# Patient Record
Sex: Female | Born: 1970 | Hispanic: No | Marital: Married | Smoking: Never smoker
Health system: Southern US, Community
[De-identification: ages and names within clinical notes are randomized; demographics above are authoritative.]

## PROBLEM LIST (undated history)

## (undated) DIAGNOSIS — I10 Essential (primary) hypertension: Secondary | ICD-10-CM

## (undated) HISTORY — PX: MULTIPLE TOOTH EXTRACTIONS: SHX2053

## (undated) HISTORY — PX: TUBAL LIGATION: SHX77

---

## 2001-08-27 ENCOUNTER — Other Ambulatory Visit: Admission: RE | Admit: 2001-08-27 | Discharge: 2001-08-27 | Payer: Self-pay | Admitting: Gynecology

## 2003-05-01 ENCOUNTER — Emergency Department (HOSPITAL_COMMUNITY): Admission: EM | Admit: 2003-05-01 | Discharge: 2003-05-01 | Payer: Self-pay | Admitting: *Deleted

## 2004-07-12 ENCOUNTER — Ambulatory Visit (HOSPITAL_COMMUNITY): Admission: RE | Admit: 2004-07-12 | Discharge: 2004-07-12 | Payer: Self-pay | Admitting: Family Medicine

## 2006-04-21 ENCOUNTER — Emergency Department (HOSPITAL_COMMUNITY): Admission: EM | Admit: 2006-04-21 | Discharge: 2006-04-21 | Payer: Self-pay | Admitting: Emergency Medicine

## 2006-05-28 ENCOUNTER — Ambulatory Visit (HOSPITAL_COMMUNITY): Admission: RE | Admit: 2006-05-28 | Discharge: 2006-05-28 | Payer: Self-pay | Admitting: Family Medicine

## 2008-02-18 ENCOUNTER — Encounter: Admission: RE | Admit: 2008-02-18 | Discharge: 2008-02-18 | Payer: Self-pay | Admitting: Family Medicine

## 2008-02-19 ENCOUNTER — Encounter (HOSPITAL_COMMUNITY): Admission: RE | Admit: 2008-02-19 | Discharge: 2008-03-20 | Payer: Self-pay | Admitting: Family Medicine

## 2009-01-13 ENCOUNTER — Other Ambulatory Visit: Admission: RE | Admit: 2009-01-13 | Discharge: 2009-01-13 | Payer: Self-pay | Admitting: Family Medicine

## 2010-12-10 ENCOUNTER — Encounter: Payer: Self-pay | Admitting: Family Medicine

## 2010-12-19 ENCOUNTER — Encounter
Admission: RE | Admit: 2010-12-19 | Discharge: 2010-12-19 | Payer: Self-pay | Source: Home / Self Care | Attending: Family Medicine | Admitting: Family Medicine

## 2012-01-31 ENCOUNTER — Other Ambulatory Visit: Payer: Self-pay | Admitting: Family Medicine

## 2012-01-31 DIAGNOSIS — Z1231 Encounter for screening mammogram for malignant neoplasm of breast: Secondary | ICD-10-CM

## 2012-03-04 ENCOUNTER — Ambulatory Visit: Payer: Self-pay

## 2012-03-12 ENCOUNTER — Ambulatory Visit
Admission: RE | Admit: 2012-03-12 | Discharge: 2012-03-12 | Disposition: A | Discharge status: Home / Self Care | Payer: BC Managed Care – PPO | Source: Ambulatory Visit | Attending: Family Medicine | Admitting: Family Medicine

## 2012-03-12 DIAGNOSIS — Z1231 Encounter for screening mammogram for malignant neoplasm of breast: Secondary | ICD-10-CM

## 2013-02-17 ENCOUNTER — Other Ambulatory Visit: Payer: Self-pay | Admitting: Family Medicine

## 2013-02-17 DIAGNOSIS — Z1231 Encounter for screening mammogram for malignant neoplasm of breast: Secondary | ICD-10-CM

## 2013-03-30 ENCOUNTER — Ambulatory Visit: Payer: BC Managed Care – PPO

## 2016-03-06 DIAGNOSIS — E559 Vitamin D deficiency, unspecified: Secondary | ICD-10-CM | POA: Diagnosis not present

## 2016-06-14 DIAGNOSIS — R195 Other fecal abnormalities: Secondary | ICD-10-CM | POA: Diagnosis not present

## 2016-06-25 DIAGNOSIS — N390 Urinary tract infection, site not specified: Secondary | ICD-10-CM | POA: Diagnosis not present

## 2016-07-24 DIAGNOSIS — N926 Irregular menstruation, unspecified: Secondary | ICD-10-CM | POA: Diagnosis not present

## 2016-08-10 DIAGNOSIS — N926 Irregular menstruation, unspecified: Secondary | ICD-10-CM | POA: Diagnosis not present

## 2016-09-05 ENCOUNTER — Other Ambulatory Visit: Payer: Self-pay | Admitting: Obstetrics and Gynecology

## 2016-09-20 ENCOUNTER — Ambulatory Visit (HOSPITAL_COMMUNITY): Admit: 2016-09-20 | Payer: Self-pay | Admitting: Obstetrics and Gynecology

## 2016-09-20 ENCOUNTER — Encounter (HOSPITAL_COMMUNITY): Payer: Self-pay

## 2016-09-20 SURGERY — DILATATION & CURETTAGE/HYSTEROSCOPY WITH MYOSURE
Anesthesia: Choice

## 2016-10-16 DIAGNOSIS — Z803 Family history of malignant neoplasm of breast: Secondary | ICD-10-CM | POA: Diagnosis not present

## 2016-10-16 DIAGNOSIS — Z1231 Encounter for screening mammogram for malignant neoplasm of breast: Secondary | ICD-10-CM | POA: Diagnosis not present

## 2017-08-30 DIAGNOSIS — Z23 Encounter for immunization: Secondary | ICD-10-CM | POA: Diagnosis not present

## 2017-10-17 DIAGNOSIS — Z1231 Encounter for screening mammogram for malignant neoplasm of breast: Secondary | ICD-10-CM | POA: Diagnosis not present

## 2017-10-21 DIAGNOSIS — Z6823 Body mass index (BMI) 23.0-23.9, adult: Secondary | ICD-10-CM | POA: Diagnosis not present

## 2017-10-21 DIAGNOSIS — Z01419 Encounter for gynecological examination (general) (routine) without abnormal findings: Secondary | ICD-10-CM | POA: Diagnosis not present

## 2017-10-21 DIAGNOSIS — Z1151 Encounter for screening for human papillomavirus (HPV): Secondary | ICD-10-CM | POA: Diagnosis not present

## 2017-11-01 DIAGNOSIS — Z13220 Encounter for screening for lipoid disorders: Secondary | ICD-10-CM | POA: Diagnosis not present

## 2017-11-01 DIAGNOSIS — Z0000 Encounter for general adult medical examination without abnormal findings: Secondary | ICD-10-CM | POA: Diagnosis not present

## 2017-11-01 DIAGNOSIS — Z1329 Encounter for screening for other suspected endocrine disorder: Secondary | ICD-10-CM | POA: Diagnosis not present

## 2017-11-01 DIAGNOSIS — Z131 Encounter for screening for diabetes mellitus: Secondary | ICD-10-CM | POA: Diagnosis not present

## 2018-04-04 DIAGNOSIS — H10413 Chronic giant papillary conjunctivitis, bilateral: Secondary | ICD-10-CM | POA: Diagnosis not present

## 2018-08-21 DIAGNOSIS — Z23 Encounter for immunization: Secondary | ICD-10-CM | POA: Diagnosis not present

## 2018-10-27 DIAGNOSIS — Z803 Family history of malignant neoplasm of breast: Secondary | ICD-10-CM | POA: Diagnosis not present

## 2018-10-27 DIAGNOSIS — Z1231 Encounter for screening mammogram for malignant neoplasm of breast: Secondary | ICD-10-CM | POA: Diagnosis not present

## 2018-10-30 DIAGNOSIS — M545 Low back pain: Secondary | ICD-10-CM | POA: Diagnosis not present

## 2019-04-23 DIAGNOSIS — U071 COVID-19: Secondary | ICD-10-CM | POA: Diagnosis not present

## 2019-06-23 DIAGNOSIS — H5213 Myopia, bilateral: Secondary | ICD-10-CM | POA: Diagnosis not present

## 2019-08-20 DIAGNOSIS — Z23 Encounter for immunization: Secondary | ICD-10-CM | POA: Diagnosis not present

## 2019-08-24 DIAGNOSIS — N390 Urinary tract infection, site not specified: Secondary | ICD-10-CM | POA: Diagnosis not present

## 2019-10-17 DIAGNOSIS — U071 COVID-19: Secondary | ICD-10-CM | POA: Diagnosis not present

## 2019-11-04 DIAGNOSIS — Z6822 Body mass index (BMI) 22.0-22.9, adult: Secondary | ICD-10-CM | POA: Diagnosis not present

## 2019-11-04 DIAGNOSIS — Z1151 Encounter for screening for human papillomavirus (HPV): Secondary | ICD-10-CM | POA: Diagnosis not present

## 2019-11-04 DIAGNOSIS — Z01419 Encounter for gynecological examination (general) (routine) without abnormal findings: Secondary | ICD-10-CM | POA: Diagnosis not present

## 2019-11-04 DIAGNOSIS — Z124 Encounter for screening for malignant neoplasm of cervix: Secondary | ICD-10-CM | POA: Diagnosis not present

## 2019-11-04 DIAGNOSIS — E559 Vitamin D deficiency, unspecified: Secondary | ICD-10-CM | POA: Diagnosis not present

## 2019-11-17 DIAGNOSIS — Z1211 Encounter for screening for malignant neoplasm of colon: Secondary | ICD-10-CM | POA: Diagnosis not present

## 2019-11-24 DIAGNOSIS — Z20828 Contact with and (suspected) exposure to other viral communicable diseases: Secondary | ICD-10-CM | POA: Diagnosis not present

## 2020-04-09 DIAGNOSIS — J029 Acute pharyngitis, unspecified: Secondary | ICD-10-CM | POA: Diagnosis not present

## 2020-08-20 DIAGNOSIS — Z23 Encounter for immunization: Secondary | ICD-10-CM | POA: Diagnosis not present

## 2020-08-28 DIAGNOSIS — S92002A Unspecified fracture of left calcaneus, initial encounter for closed fracture: Secondary | ICD-10-CM | POA: Diagnosis not present

## 2020-08-31 DIAGNOSIS — S92002A Unspecified fracture of left calcaneus, initial encounter for closed fracture: Secondary | ICD-10-CM | POA: Diagnosis not present

## 2020-09-01 ENCOUNTER — Other Ambulatory Visit: Payer: Self-pay | Admitting: Orthopaedic Surgery

## 2020-09-01 ENCOUNTER — Ambulatory Visit
Admission: RE | Admit: 2020-09-01 | Discharge: 2020-09-01 | Disposition: A | Discharge status: Home / Self Care | Payer: BC Managed Care – PPO | Source: Ambulatory Visit | Attending: Orthopaedic Surgery | Admitting: Orthopaedic Surgery

## 2020-09-01 DIAGNOSIS — S92002A Unspecified fracture of left calcaneus, initial encounter for closed fracture: Secondary | ICD-10-CM | POA: Diagnosis not present

## 2020-09-01 DIAGNOSIS — M79672 Pain in left foot: Secondary | ICD-10-CM

## 2020-09-01 DIAGNOSIS — R600 Localized edema: Secondary | ICD-10-CM | POA: Diagnosis not present

## 2020-09-01 DIAGNOSIS — S92212A Displaced fracture of cuboid bone of left foot, initial encounter for closed fracture: Secondary | ICD-10-CM | POA: Diagnosis not present

## 2020-09-05 ENCOUNTER — Other Ambulatory Visit: Payer: Self-pay | Admitting: Orthopaedic Surgery

## 2020-09-06 ENCOUNTER — Encounter (HOSPITAL_COMMUNITY): Payer: Self-pay | Admitting: Orthopaedic Surgery

## 2020-09-06 ENCOUNTER — Other Ambulatory Visit: Payer: Self-pay

## 2020-09-06 ENCOUNTER — Other Ambulatory Visit (HOSPITAL_COMMUNITY)
Admission: RE | Admit: 2020-09-06 | Discharge: 2020-09-06 | Disposition: A | Discharge status: Home / Self Care | Payer: BC Managed Care – PPO | Source: Ambulatory Visit | Attending: Orthopaedic Surgery | Admitting: Orthopaedic Surgery

## 2020-09-06 DIAGNOSIS — Z20822 Contact with and (suspected) exposure to covid-19: Secondary | ICD-10-CM | POA: Diagnosis not present

## 2020-09-06 DIAGNOSIS — Z01812 Encounter for preprocedural laboratory examination: Secondary | ICD-10-CM | POA: Insufficient documentation

## 2020-09-06 LAB — SARS CORONAVIRUS 2 (TAT 6-24 HRS): SARS Coronavirus 2: NEGATIVE

## 2020-09-06 NOTE — Progress Notes (Signed)
Patient was given pre-op instructions over the phone. The opportunity was given for the patient to ask questions. No further questions asked. Patient verbalized understanding of instructions given.   7 days prior to surgery STOP taking any Aspirin (unless otherwise instructed by your surgeon), Aleve, Naproxen, Ibuprofen, Motrin, Advil, Goody's, BC's, all herbal medications, fish oil, and all vitamins.      PCP - [facility] - denies  Chest x-ray - denies EKG - denies Stress Test - denies ECHO - denies Cardiac Cath - denies  ERAS Protcol - Clears until 1030 am PRE-SURGERY Ensure or G2- not given pt is SDW  COVID TEST- 10/19 pending result   Anesthesia review: NO  Patient denies shortness of breath, fever, cough and chest pain at PAT appointment   All instructions explained to the patient, with a verbal understanding of the material. Patient agrees to go over the instructions while at home for a better understanding. Patient also instructed to self quarantine after being tested for COVID-19. The opportunity to ask questions was provided.

## 2020-09-08 ENCOUNTER — Ambulatory Visit (HOSPITAL_COMMUNITY): Payer: BC Managed Care – PPO | Admitting: Certified Registered"

## 2020-09-08 ENCOUNTER — Encounter (HOSPITAL_COMMUNITY)
Admission: RE | Disposition: A | Discharge status: Home / Self Care | Payer: Self-pay | Source: Home / Self Care | Attending: Orthopaedic Surgery

## 2020-09-08 ENCOUNTER — Other Ambulatory Visit: Payer: Self-pay

## 2020-09-08 ENCOUNTER — Ambulatory Visit (HOSPITAL_COMMUNITY): Payer: BC Managed Care – PPO

## 2020-09-08 ENCOUNTER — Ambulatory Visit (HOSPITAL_COMMUNITY)
Admission: RE | Admit: 2020-09-08 | Discharge: 2020-09-08 | Disposition: A | Discharge status: Home / Self Care | Payer: BC Managed Care – PPO | Attending: Orthopaedic Surgery | Admitting: Orthopaedic Surgery

## 2020-09-08 ENCOUNTER — Encounter (HOSPITAL_COMMUNITY): Payer: Self-pay | Admitting: Orthopaedic Surgery

## 2020-09-08 DIAGNOSIS — Y9289 Other specified places as the place of occurrence of the external cause: Secondary | ICD-10-CM | POA: Insufficient documentation

## 2020-09-08 DIAGNOSIS — Z79899 Other long term (current) drug therapy: Secondary | ICD-10-CM | POA: Insufficient documentation

## 2020-09-08 DIAGNOSIS — Z419 Encounter for procedure for purposes other than remedying health state, unspecified: Secondary | ICD-10-CM

## 2020-09-08 DIAGNOSIS — S92062A Displaced intraarticular fracture of left calcaneus, initial encounter for closed fracture: Secondary | ICD-10-CM | POA: Diagnosis not present

## 2020-09-08 DIAGNOSIS — Y939 Activity, unspecified: Secondary | ICD-10-CM | POA: Diagnosis not present

## 2020-09-08 DIAGNOSIS — W138XXA Fall from, out of or through other building or structure, initial encounter: Secondary | ICD-10-CM | POA: Insufficient documentation

## 2020-09-08 DIAGNOSIS — Z791 Long term (current) use of non-steroidal anti-inflammatories (NSAID): Secondary | ICD-10-CM | POA: Insufficient documentation

## 2020-09-08 DIAGNOSIS — S92002A Unspecified fracture of left calcaneus, initial encounter for closed fracture: Secondary | ICD-10-CM | POA: Diagnosis not present

## 2020-09-08 DIAGNOSIS — I10 Essential (primary) hypertension: Secondary | ICD-10-CM | POA: Diagnosis not present

## 2020-09-08 DIAGNOSIS — G8918 Other acute postprocedural pain: Secondary | ICD-10-CM | POA: Diagnosis not present

## 2020-09-08 HISTORY — DX: Essential (primary) hypertension: I10

## 2020-09-08 HISTORY — PX: OPEN REDUCTION, INTERNAL FIXATION (ORIF) CALCANEAL FRACTURE WITH FUSION: SHX5994

## 2020-09-08 LAB — POCT I-STAT, CHEM 8
BUN: 7 mg/dL (ref 6–20)
Calcium, Ion: 1.13 mmol/L — ABNORMAL LOW (ref 1.15–1.40)
Chloride: 104 mmol/L (ref 98–111)
Creatinine, Ser: 0.6 mg/dL (ref 0.44–1.00)
Glucose, Bld: 89 mg/dL (ref 70–99)
HCT: 39 % (ref 36.0–46.0)
Hemoglobin: 13.3 g/dL (ref 12.0–15.0)
Potassium: 3.9 mmol/L (ref 3.5–5.1)
Sodium: 137 mmol/L (ref 135–145)
TCO2: 24 mmol/L (ref 22–32)

## 2020-09-08 LAB — POCT PREGNANCY, URINE: Preg Test, Ur: NEGATIVE

## 2020-09-08 SURGERY — OPEN REDUCTION, INTERNAL FIXATION (ORIF) CALCANEAL FRACTURE WITH FUSION
Anesthesia: Regional | Site: Foot | Laterality: Left

## 2020-09-08 MED ORDER — LACTATED RINGERS IV SOLN: INTRAVENOUS | Status: DC

## 2020-09-08 MED ORDER — FENTANYL CITRATE (PF) 100 MCG/2ML IJ SOLN: 100.0000 ug | Freq: Once | INTRAMUSCULAR | Status: AC

## 2020-09-08 MED ORDER — CEFAZOLIN SODIUM-DEXTROSE 2-4 GM/100ML-% IV SOLN
2.0000 g | INTRAVENOUS | Status: AC
  Administered 2020-09-08: 2 g via INTRAVENOUS

## 2020-09-08 MED ORDER — MIDAZOLAM HCL 2 MG/2ML IJ SOLN: 2.0000 mg | Freq: Once | INTRAMUSCULAR | Status: AC

## 2020-09-08 MED ORDER — VANCOMYCIN HCL 500 MG IV SOLR
INTRAVENOUS | Status: DC | PRN
  Administered 2020-09-08: 500 mg

## 2020-09-08 MED ORDER — DEXAMETHASONE SODIUM PHOSPHATE 10 MG/ML IJ SOLN
INTRAMUSCULAR | Status: AC
  Filled 2020-09-08: qty 2

## 2020-09-08 MED ORDER — MIDAZOLAM HCL 2 MG/2ML IJ SOLN
INTRAMUSCULAR | Status: AC
  Administered 2020-09-08: 2 mg via INTRAVENOUS
  Filled 2020-09-08: qty 2

## 2020-09-08 MED ORDER — AMISULPRIDE (ANTIEMETIC) 5 MG/2ML IV SOLN: 10.0000 mg | Freq: Once | INTRAVENOUS | Status: AC | PRN

## 2020-09-08 MED ORDER — CHLORHEXIDINE GLUCONATE 0.12 % MT SOLN
OROMUCOSAL | Status: AC
  Administered 2020-09-08: 15 mL
  Filled 2020-09-08: qty 15

## 2020-09-08 MED ORDER — PROPOFOL 10 MG/ML IV BOLUS
INTRAVENOUS | Status: DC | PRN
  Administered 2020-09-08: 200 mg via INTRAVENOUS

## 2020-09-08 MED ORDER — ESMOLOL HCL 100 MG/10ML IV SOLN
INTRAVENOUS | Status: AC
  Filled 2020-09-08: qty 10

## 2020-09-08 MED ORDER — ROPIVACAINE HCL 5 MG/ML IJ SOLN
INTRAMUSCULAR | Status: DC | PRN
  Administered 2020-09-08: 45 mL via EPIDURAL

## 2020-09-08 MED ORDER — AMISULPRIDE (ANTIEMETIC) 5 MG/2ML IV SOLN
INTRAVENOUS | Status: AC
  Administered 2020-09-08: 10 mg via INTRAVENOUS
  Filled 2020-09-08: qty 2

## 2020-09-08 MED ORDER — ASPIRIN 325 MG PO TABS: 325.0000 mg | ORAL_TABLET | Freq: Every day | ORAL | 11 refills | Status: AC

## 2020-09-08 MED ORDER — DEXAMETHASONE SODIUM PHOSPHATE 10 MG/ML IJ SOLN
INTRAMUSCULAR | Status: DC | PRN
  Administered 2020-09-08: 10 mg via INTRAVENOUS

## 2020-09-08 MED ORDER — ACETAMINOPHEN 500 MG PO TABS
ORAL_TABLET | ORAL | Status: AC
  Administered 2020-09-08: 1000 mg via ORAL
  Filled 2020-09-08: qty 2

## 2020-09-08 MED ORDER — PROPOFOL 10 MG/ML IV BOLUS
INTRAVENOUS | Status: AC
  Filled 2020-09-08: qty 20

## 2020-09-08 MED ORDER — LIDOCAINE 2% (20 MG/ML) 5 ML SYRINGE
INTRAMUSCULAR | Status: AC
  Filled 2020-09-08: qty 5

## 2020-09-08 MED ORDER — 0.9 % SODIUM CHLORIDE (POUR BTL) OPTIME
TOPICAL | Status: DC | PRN
  Administered 2020-09-08: 1000 mL

## 2020-09-08 MED ORDER — ACETAMINOPHEN 500 MG PO TABS: 1000.0000 mg | ORAL_TABLET | Freq: Once | ORAL | Status: AC

## 2020-09-08 MED ORDER — ONDANSETRON HCL 4 MG/2ML IJ SOLN
INTRAMUSCULAR | Status: DC | PRN
  Administered 2020-09-08: 4 mg via INTRAVENOUS

## 2020-09-08 MED ORDER — CEFAZOLIN SODIUM-DEXTROSE 2-4 GM/100ML-% IV SOLN
INTRAVENOUS | Status: AC
  Filled 2020-09-08: qty 100

## 2020-09-08 MED ORDER — GLYCOPYRROLATE PF 0.2 MG/ML IJ SOSY
PREFILLED_SYRINGE | INTRAMUSCULAR | Status: AC
  Filled 2020-09-08: qty 1

## 2020-09-08 MED ORDER — FENTANYL CITRATE (PF) 100 MCG/2ML IJ SOLN
INTRAMUSCULAR | Status: AC
  Administered 2020-09-08: 100 ug via INTRAVENOUS
  Filled 2020-09-08: qty 2

## 2020-09-08 MED ORDER — VANCOMYCIN HCL 500 MG IV SOLR
INTRAVENOUS | Status: AC
  Filled 2020-09-08: qty 500

## 2020-09-08 MED ORDER — PROMETHAZINE HCL 25 MG/ML IJ SOLN: 6.2500 mg | INTRAMUSCULAR | Status: DC | PRN

## 2020-09-08 MED ORDER — HYDROMORPHONE HCL 1 MG/ML IJ SOLN: 0.2500 mg | INTRAMUSCULAR | Status: DC | PRN

## 2020-09-08 MED ORDER — FENTANYL CITRATE (PF) 250 MCG/5ML IJ SOLN
INTRAMUSCULAR | Status: AC
  Filled 2020-09-08: qty 5

## 2020-09-08 MED ORDER — OXYCODONE HCL 5 MG PO TABS
5.0000 mg | ORAL_TABLET | Freq: Three times a day (TID) | ORAL | 0 refills | Status: AC | PRN
Start: 2020-09-08 — End: 2020-09-15

## 2020-09-08 MED ORDER — FENTANYL CITRATE (PF) 250 MCG/5ML IJ SOLN
INTRAMUSCULAR | Status: DC | PRN
  Administered 2020-09-08 (×2): 50 ug via INTRAVENOUS

## 2020-09-08 MED ORDER — LIDOCAINE 2% (20 MG/ML) 5 ML SYRINGE
INTRAMUSCULAR | Status: DC | PRN
  Administered 2020-09-08: 20 mg via INTRAVENOUS

## 2020-09-08 SURGICAL SUPPLY — 66 items
APL PRP STRL LF DISP 70% ISPRP (MISCELLANEOUS) ×1
APL SKNCLS STERI-STRIP NONHPOA (GAUZE/BANDAGES/DRESSINGS)
BANDAGE ESMARK 6X9 LF (GAUZE/BANDAGES/DRESSINGS) IMPLANT
BENZOIN TINCTURE PRP APPL 2/3 (GAUZE/BANDAGES/DRESSINGS) IMPLANT
BIT DRILL 2.5 CANN STRL (BIT) ×1 IMPLANT
BIT DRILL 2.5 STRGHT CANN (BIT) ×1 IMPLANT
BLADE SURG 15 STRL LF DISP TIS (BLADE) ×2 IMPLANT
BLADE SURG 15 STRL SS (BLADE) ×4
BNDG CMPR 9X6 STRL LF SNTH (GAUZE/BANDAGES/DRESSINGS)
BNDG CMPR MED 10X6 ELC LF (GAUZE/BANDAGES/DRESSINGS)
BNDG ELASTIC 6X10 VLCR STRL LF (GAUZE/BANDAGES/DRESSINGS) ×1 IMPLANT
BNDG ESMARK 6X9 LF (GAUZE/BANDAGES/DRESSINGS)
CHLORAPREP W/TINT 26 (MISCELLANEOUS) ×2 IMPLANT
COVER WAND RF STERILE (DRAPES) IMPLANT
CUFF TOURN SGL QUICK 34 (TOURNIQUET CUFF) ×2
CUFF TRNQT CYL 34X4.125X (TOURNIQUET CUFF) ×1 IMPLANT
DECANTER SPIKE VIAL GLASS SM (MISCELLANEOUS) IMPLANT
DRAPE C-ARM 42X72 X-RAY (DRAPES) ×2 IMPLANT
DRAPE C-ARMOR (DRAPES) ×2 IMPLANT
DRAPE EXTREMITY T 121X128X90 (DISPOSABLE) ×2 IMPLANT
DRAPE IMP U-DRAPE 54X76 (DRAPES) ×2 IMPLANT
DRAPE U-SHAPE 47X51 STRL (DRAPES) ×2 IMPLANT
DRSG PAD ABDOMINAL 8X10 ST (GAUZE/BANDAGES/DRESSINGS) ×1 IMPLANT
ELECT REM PT RETURN 9FT ADLT (ELECTROSURGICAL) ×2
ELECTRODE REM PT RTRN 9FT ADLT (ELECTROSURGICAL) ×1 IMPLANT
GAUZE SPONGE 4X4 12PLY STRL (GAUZE/BANDAGES/DRESSINGS) ×2 IMPLANT
GAUZE SPONGE 4X4 12PLY STRL LF (GAUZE/BANDAGES/DRESSINGS) ×2 IMPLANT
GAUZE XEROFORM 5X9 LF (GAUZE/BANDAGES/DRESSINGS) ×1 IMPLANT
GLOVE BIOGEL M STRL SZ7.5 (GLOVE) ×3 IMPLANT
GLOVE BIOGEL PI IND STRL 8 (GLOVE) ×1 IMPLANT
GLOVE BIOGEL PI INDICATOR 8 (GLOVE) ×1
GOWN STRL REUS W/ TWL LRG LVL3 (GOWN DISPOSABLE) ×1 IMPLANT
GOWN STRL REUS W/ TWL XL LVL3 (GOWN DISPOSABLE) ×1 IMPLANT
GOWN STRL REUS W/TWL LRG LVL3 (GOWN DISPOSABLE) ×2
GOWN STRL REUS W/TWL XL LVL3 (GOWN DISPOSABLE) ×2
K-WIRE TROCAR 1.35 (MISCELLANEOUS) ×4
KIT BASIN OR (CUSTOM PROCEDURE TRAY) ×2 IMPLANT
KWIRE TROCAR 1.35 (MISCELLANEOUS) IMPLANT
NS IRRIG 1000ML POUR BTL (IV SOLUTION) ×2 IMPLANT
PACK ORTHO EXTREMITY (CUSTOM PROCEDURE TRAY) ×2 IMPLANT
PAD CAST 4YDX4 CTTN HI CHSV (CAST SUPPLIES) ×1 IMPLANT
PADDING CAST COTTON 4X4 STRL (CAST SUPPLIES) ×2
PADDING CAST SYNTHETIC 4 (CAST SUPPLIES) ×1
PADDING CAST SYNTHETIC 4X4 STR (CAST SUPPLIES) ×1 IMPLANT
PLATE CALC PERC LONG LT (Plate) ×1 IMPLANT
PUTTY BONE DBX 2.5 MIS (Bone Implant) ×1 IMPLANT
SCOTCHCAST PLUS 4X4 WHITE (CAST SUPPLIES) ×1 IMPLANT
SCREW CANN TI ST QF 4X42 (Screw) ×1 IMPLANT
SCREW KREULOCK 3.5 X 28 (Screw) ×1 IMPLANT
SCREW LOCK FT SD 3.5X28 (Screw) ×2 IMPLANT
SCREW LOCK TI FT 3.5X24 (Screw) ×1 IMPLANT
SCREW LOCK TI FT 3.5X32 (Screw) ×1 IMPLANT
SCREW LP TIT 3.5X30 (Screw) ×2 IMPLANT
SPONGE LAP 18X18 RF (DISPOSABLE) IMPLANT
STRIP CLOSURE SKIN 1/2X4 (GAUZE/BANDAGES/DRESSINGS) IMPLANT
SUCTION FRAZIER HANDLE 10FR (MISCELLANEOUS) ×2
SUCTION TUBE FRAZIER 10FR DISP (MISCELLANEOUS) ×1 IMPLANT
SUT ETHILON 3 0 PS 1 (SUTURE) ×1 IMPLANT
SUT MNCRL AB 3-0 PS2 18 (SUTURE) ×2 IMPLANT
SUT PDS AB 2-0 CT2 27 (SUTURE) ×1 IMPLANT
SUT VIC AB 0 CT1 27 (SUTURE)
SUT VIC AB 0 CT1 27XBRD ANBCTR (SUTURE) IMPLANT
SYR BULB IRRIG 60ML STRL (SYRINGE) ×1 IMPLANT
TOWEL GREEN STERILE FF (TOWEL DISPOSABLE) ×4 IMPLANT
TUBE CONNECTING 20X1/4 (TUBING) ×4 IMPLANT
UNDERPAD 30X36 HEAVY ABSORB (UNDERPADS AND DIAPERS) ×2 IMPLANT

## 2020-09-08 NOTE — H&P (Signed)
PREOPERATIVE H&P  Chief Complaint: Left displaced intra-articular calcaneus fracture  HPI: Dana Howard is a 49 y.o. female who presents for preoperative history and physical with a diagnosis of left displaced intra-articular calcaneus fracture.  She had to jump from a two-story window from her apartment and sustained the above fracture.  She was seen at the urgent care where diagnosis was made.  She was splinted and followed up with me in the clinic.  CT scan was obtained which demonstrated depression of the posterior facet significantly.  There was comminution at the fracture site and some tipping up of the posterior tuberosity with concern for long-term skin issues.  Given these findings she was indicated for surgery.  She is here today for surgical correction.  She did remove her splint prior to coming.  She is relatively swollen with some wrinkles present.. Symptoms are rated as moderate to severe, and have been worsening.  This is significantly impairing activities of daily living.  She has elected for surgical management.   Past Medical History:  Diagnosis Date  . Hypertension    Past Surgical History:  Procedure Laterality Date  . MULTIPLE TOOTH EXTRACTIONS    . TUBAL LIGATION     Social History   Socioeconomic History  . Marital status: Married    Spouse name: Not on file  . Number of children: Not on file  . Years of education: Not on file  . Highest education level: Not on file  Occupational History  . Not on file  Tobacco Use  . Smoking status: Never Smoker  . Smokeless tobacco: Never Used  Vaping Use  . Vaping Use: Never used  Substance and Sexual Activity  . Alcohol use: Yes    Comment: occasionally  . Drug use: Never  . Sexual activity: Not on file  Other Topics Concern  . Not on file  Social History Narrative  . Not on file   Social Determinants of Health   Financial Resource Strain:   . Difficulty of Paying Living Expenses: Not on file  Food  Insecurity:   . Worried About Programme researcher, broadcasting/film/video in the Last Year: Not on file  . Ran Out of Food in the Last Year: Not on file  Transportation Needs:   . Lack of Transportation (Medical): Not on file  . Lack of Transportation (Non-Medical): Not on file  Physical Activity:   . Days of Exercise per Week: Not on file  . Minutes of Exercise per Session: Not on file  Stress:   . Feeling of Stress : Not on file  Social Connections:   . Frequency of Communication with Friends and Family: Not on file  . Frequency of Social Gatherings with Friends and Family: Not on file  . Attends Religious Services: Not on file  . Active Member of Clubs or Organizations: Not on file  . Attends Banker Meetings: Not on file  . Marital Status: Not on file   History reviewed. No pertinent family history. No Known Allergies Prior to Admission medications   Medication Sig Start Date End Date Taking? Authorizing Provider  acetaminophen (TYLENOL) 500 MG tablet Take 1,000 mg by mouth every 6 (six) hours as needed for moderate pain or headache.   Yes [provider]  amLODipine (NORVASC) 5 MG tablet Take 5 mg by mouth daily as needed (if bp is 145/90 or above).   Yes [provider]  ibuprofen (ADVIL) 800 MG tablet Take 800 mg by mouth every  8 (eight) hours as needed for pain. 08/28/20  Yes [provider]  traMADol (ULTRAM) 50 MG tablet Take 50 mg by mouth every 8 (eight) hours as needed for pain. 08/29/20  Yes [provider]  APPLE CIDER VINEGAR PO Take 2 capsules by mouth daily.    [provider]     Positive ROS: All other systems have been reviewed and were otherwise negative with the exception of those mentioned in the HPI and as above.  Physical Exam:  Vitals:   09/08/20 1235 09/08/20 1240  BP: 120/89 133/83  Pulse: 71 71  Resp:    Temp:    SpO2: 100% 100%   General: Alert, no acute distress Cardiovascular: No pedal edema Respiratory: No  cyanosis, no use of accessory musculature GI: No organomegaly, abdomen is soft and non-tender Skin: No lesions in the area of chief complaint Neurologic: Sensation intact distally Psychiatric: Patient is competent for consent with normal mood and affect Lymphatic: No axillary or cervical lymphadenopathy  MUSCULOSKELETAL: Left leg shows splint in disarray.  She had removed it.  Swelling of the dorsal foot.  No tenderness to the plantar and dorsal forefoot.  No tenderness proximal to the splint.  Foot is warm and well-perfused.  Endorses sensation to light touch about the toes.  Assessment: Left displaced intra-articular calcaneus fracture   Plan: Plan for open treatment of her calcaneus fracture.  We will assess for stability of the peroneal tendons and repair affixes as needed.  She understands the need for splint immobilization afterwards and we will likely place her in a cast univalved or bivalved with this.  She will be discharged home from the PACU if appropriate..  We discussed the risks, benefits and alternatives of surgery which include but are not limited to wound healing complications, infection, nonunion, malunion, need for further surgery, damage to surrounding structures and continued pain.  They understand there is no guarantees to an acceptable outcome.  After weighing these risks they opted to proceed with surgery.     Terance Hart, MD    09/08/2020 1:28 PM

## 2020-09-08 NOTE — Anesthesia Preprocedure Evaluation (Addendum)
Anesthesia Evaluation  Patient identified by MRN, date of birth, ID band Patient awake    Reviewed: Allergy & Precautions, NPO status , Patient's Chart, lab work & pertinent test results  Airway Mallampati: I  TM Distance: >3 FB Neck ROM: Full    Dental  (+)    Pulmonary neg pulmonary ROS,    Pulmonary exam normal breath sounds clear to auscultation       Cardiovascular Exercise Tolerance: Good METS: 5 - 7 Mets hypertension, Normal cardiovascular exam Rhythm:Regular Rate:Normal     Neuro/Psych negative neurological ROS  negative psych ROS   GI/Hepatic negative GI ROS, Neg liver ROS,   Endo/Other    Renal/GU negative Renal ROS  negative genitourinary   Musculoskeletal negative musculoskeletal ROS (+)   Abdominal Normal abdominal exam  (+)   Peds negative pediatric ROS (+)  Hematology negative hematology ROS (+)   Anesthesia Other Findings   Reproductive/Obstetrics negative OB ROS                            Anesthesia Physical Anesthesia Plan  ASA: II  Anesthesia Plan: General and Regional   Post-op Pain Management: GA combined w/ Regional for post-op pain   Induction:   PONV Risk Score and Plan: 3 and Ondansetron, Dexamethasone, Midazolam and Treatment may vary due to age or medical condition  Airway Management Planned: LMA  Additional Equipment:   Intra-op Plan:   Post-operative Plan: Extubation in OR  Informed Consent: I have reviewed the patients History and Physical, chart, labs and discussed the procedure including the risks, benefits and alternatives for the proposed anesthesia with the patient or authorized representative who has indicated his/her understanding and acceptance.     Dental advisory given  Plan Discussed with: CRNA, Anesthesiologist and Surgeon  Anesthesia Plan Comments:         Anesthesia Quick Evaluation

## 2020-09-08 NOTE — Op Note (Signed)
Dana Howard female 49 y.o. 09/08/2020  PreOperative Diagnosis: Left displaced intra-articular calcaneus fracture  PostOperative Diagnosis: Same  PROCEDURE: Open reduction internal fixation of left intra-articular calcaneus fracture  SURGEON: Dub Mikes, MD  ASSISTANT: Marylene Land, RNFA  ANESTHESIA: General with peripheral nerve blockade  FINDINGS: Displaced intra-articular calcaneus fracture with comminution and displacement  IMPLANTS: Arthrex minimally invasive calcaneus locking plate 4.0 mm cannulated screw  INDICATIONS:49 y.o. female jumped to stories from an apartment building window and sustained a calcaneus fracture.  I CT and x-rays revealed intra-articular calcaneus fracture with displacement of the posterior facet and posterior tuberosity.  Given the amount of displacement she was indicated for open treatment.   Patient understood the risks, benefits and alternatives to surgery which include but are not limited to wound healing complications, infection, nonunion, malunion, need for further surgery as well as damage to surrounding structures. They also understood the potential for continued pain in that there were no guarantees of acceptable outcome After weighing these risks the patient opted to proceed with surgery.  PROCEDURE: Patient was identified in the preoperative holding area.  The left leg was marked by myself.  Consent was signed by myself and the patient.  Block was performed by anesthesia in the preoperative holding area.  Patient was taken to the operative suite and placed supine on the operative table.  General LMA anesthesia anesthesia was induced without difficulty.  Then the patient was placed in the right lateral decubitus position on a beanbag.  All bony prominences were well-padded.  A platform was created out of folded blankets. Tourniquet was placed on the operative thigh.  Preoperative antibiotics were given. The extremity was prepped and draped  in the usual sterile fashion and surgical timeout was performed.  The limb was elevated and the tourniquet was inflated to 250 mmHg.  We began making a longitudinal incision overlying the sinus Tarsi from the tip of the fibula in line with the fourth ray.  This was taken sharply down through skin and subcutaneous tissue.  The blunt dissection was used to identify the peroneal tendon sheath and this was opened and mobilized.  Then Glorious Peach was placed within the sheath and the retrofibular groove did check the competence of the superior peroneal retinaculum which was competent.  The tendons were intact without evidence of tearing.  Then further dissection was carried down to the sinus Tarsi.  The sinus Tarsi was entered as was the subtalar joint.  Rondure and 15 blade was used to excise fracture hematoma within the sinus.  Then the lateral wall portion of the calcaneus was identified and sharp dissection was created around this area.  There is some comminution and fracturing through the lateral wall.  The piece was mobilized off the overlying soft tissue.  Then using a osteotome the soft tissue was mobilized and elevated in a subperiosteal fashion from the lateral calcaneal tuberosity posteriorly.  Then the sinus Tarsi was further examined and the interosseous ligaments were transected to gain access into the subtalar joint.  The fracture sites were identified and rondure and curette were used to clean out the fracture sites.  There is significant depression of the posterior facet there is attached to the tuberosity piece.  Using a Glorious Peach and a osteotome the fracture fragments were mobilized.  Again rondure was used to clear out fracture hematoma and fibrinous tissue.  Then under direct visualization the posterior tuberosity and facet piece was mobilized and held provisionally against the talus at the posterior facet.  Then  the more anterior and anterior process fracture fragments were then reduced back to this and held  provisionally with a pointed reduction forcep.  X-ray fluoroscopy images indicated that there was continued elevation of the posterior tuberosity piece of the calcaneus fracture and therefore percutaneous placement of a Weber clamp was used to close down the fracture gap and to reduce it.  The Weber clamp held the fracture provisionally.  Then after manual manipulation the fracture fragments were further reduced in acceptable position and confirmed on fluoroscopy to be so.  Any minimally invasive locking plate was placed on the lateral wall.  Bone graft was placed within the defect within the calcaneal body and the lateral wall piece was placed back underneath the plate.  Then a combination of nonlocking and locking screws were used to fix the fracture underneath the locking plate.  Then a small stab incision was made just lateral to the Achilles tendon posteriorly.  A wire was then placed through the dorsal aspect of the calcaneal tuberosity down to the plantar aspect crossing the tuberosity fragment.  Then a 4.0 mm cannulated screw was placed across this to fix the fracture fragment there.  Again there was adequate reduction and confirmation of screw length was confirmed on fluoroscopy in a lateral and Harris view.  Then the wound was irrigated copiously with normal saline.  500 mg of vancomycin powder was placed within the wound.  The soft tissue deeply was closed with 0 Monocryl suture.  The tourniquet was released.  Then the overlying tissue was closed in a layered fashion using 3-0 Monocryl and the skin with nylon.  Soft dressing was placed including Xeroform, 4 x 4's, ABD pad and sterile she cotton.  She was then placed in a nonweightbearing short leg cast that was univalved medially to allow for swelling.  This was then overwrapped loosely with Coban.  She was then moved to her hospital bed and extubated without difficulty.  All counts were correct at the end the case.  There were no complications.   She tolerated this well.   POST OPERATIVE INSTRUCTIONS: Nonweightbearing to operative extremity Take 1 325 mg aspirin daily for DVT prophylaxis Keep cast dry Elevate limb Call the office with concerns Follow-up in 2 weeks for cast removal, suture removal if appropriate, nonweightbearing x-rays of the left foot and placement of a short leg nonweightbearing cast  TOURNIQUET TIME: 1 hour and 29 minutes  BLOOD LOSS:  less than 50 mL         DRAINS: none         SPECIMEN: none       COMPLICATIONS:  * No complications entered in OR log *         Disposition: PACU - hemodynamically stable.         Condition: stable

## 2020-09-08 NOTE — Anesthesia Procedure Notes (Signed)
Anesthesia Regional Block: Adductor canal block   Pre-Anesthetic Checklist: ,, timeout performed, Correct Patient, Correct Site, Correct Laterality, Correct Procedure, Correct Position, site marked, Risks and benefits discussed,  Surgical consent,  Pre-op evaluation,  At surgeon's request and post-op pain management  Laterality: Left  Prep: chloraprep       Needles:  Injection technique: Single-shot  Needle Type: Echogenic Stimulator Needle     Needle Length: 10cm  Needle Gauge: 20     Additional Needles:   Procedures:,,,, ultrasound used (permanent image in chart),,,,  Narrative:  Start time: 09/08/2020 12:35 PM End time: 09/08/2020 12:45 PM Injection made incrementally with aspirations every 5 mL.  Performed by: Personally  Anesthesiologist: Mellody Dance, MD  Additional Notes: A functioning IV was confirmed and monitors were applied.  Sterile prep and drape, hand hygiene and sterile gloves were used.  Negative aspiration and test dose prior to incremental administration of local anesthetic. The patient tolerated the procedure well.Ultrasound  guidance: relevant anatomy identified, needle position confirmed, local anesthetic spread visualized around nerve(s), vascular puncture avoided.  Image printed for medical record.

## 2020-09-08 NOTE — Anesthesia Procedure Notes (Signed)
Anesthesia Regional Block: Popliteal block   Pre-Anesthetic Checklist: ,, timeout performed, Correct Patient, Correct Site, Correct Laterality, Correct Procedure, Correct Position, site marked, Risks and benefits discussed,  Surgical consent,  Pre-op evaluation,  At surgeon's request and post-op pain management  Laterality: Left  Prep: chloraprep       Needles:  Injection technique: Single-shot  Needle Type: Echogenic Stimulator Needle     Needle Length: 10cm  Needle Gauge: 20     Additional Needles:   Procedures:,,,, ultrasound used (permanent image in chart),,,,  Narrative:  Start time: 09/08/2020 12:35 PM End time: 09/08/2020 12:45 PM Injection made incrementally with aspirations every 5 mL.  Performed by: Personally  Anesthesiologist: Mellody Dance, MD  Additional Notes: A functioning IV was confirmed and monitors were applied.  Sterile prep and drape, hand hygiene and sterile gloves were used.  Negative aspiration and test dose prior to incremental administration of local anesthetic. The patient tolerated the procedure well.Ultrasound  guidance: relevant anatomy identified, needle position confirmed, local anesthetic spread visualized around nerve(s), vascular puncture avoided.  Image printed for medical record.

## 2020-09-08 NOTE — Transfer of Care (Signed)
Immediate Anesthesia Transfer of Care Note  Patient: Dana Howard  Procedure(s) Performed: OPEN TREATMENT OF LEFT CALCANEUS FRACTURE (Left Foot)  Patient Location: PACU  Anesthesia Type:GA combined with regional for post-op pain  Level of Consciousness: awake, alert , oriented and patient cooperative  Airway & Oxygen Therapy: Patient Spontanous Breathing and Patient connected to face mask oxygen  Post-op Assessment: Report given to RN and Post -op Vital signs reviewed and stable  Post vital signs: Reviewed and stable  Last Vitals:  Vitals Value Taken Time  BP 136/88 09/08/20 1631  Temp    Pulse 65 09/08/20 1634  Resp 15 09/08/20 1634  SpO2 100 % 09/08/20 1634  Vitals shown include unvalidated device data.  Last Pain:  Vitals:   09/08/20 1142  TempSrc:   PainSc: 5       Patients Stated Pain Goal: 2 (09/08/20 1142)  Complications: No complications documented.

## 2020-09-08 NOTE — Discharge Instructions (Signed)
DR. Berdene Ask[facility] POST-OP INSTRUCTIONS   Pain Management 1. The numbing medicine and your leg will last around 18 hours, take a dose of your pain medicine as soon as you feel it wearing off to avoid rebound pain. 2. Keep your foot elevated above heart level.  Make sure that your heel hangs free ('floats'). 3. Take all prescribed medication as directed. 4. If taking narcotic pain medication you may want to use an over-the-counter stool softener to avoid constipation. 5. You may take over-the-counter NSAIDs (ibuprofen, naproxen, etc.) as well as over-the-counter acetaminophen as directed on the packaging as a supplement for your pain and may also use it to wean away from the prescription medication.  Activity ? Non-weightbearing ? Keep splint intact  First Postoperative Visit 1. Your first postop visit will be at least 2 weeks after surgery.  This should be scheduled when you schedule surgery. 2. If you do not have a postoperative visit scheduled please call [phone] to schedule an appointment. 3. At the appointment your incision will be evaluated for suture removal, x-rays will be obtained if necessary.  General Instructions 1. Swelling is very common after foot and ankle surgery.  It often takes 3 months for the foot and ankle to begin to feel comfortable.  Some amount of swelling will persist for 6-12 months. 2. DO NOT change the dressing.  If there is a problem with the dressing (too tight, loose, gets wet, etc.) please contact Dr. Philicia Heyne's office. 3. DO NOT get the dressing wet.  For showers you can use an over-the-counter cast cover or wrap a washcloth around the top of your dressing and then cover it with a plastic bag and tape it to your leg. 4. DO NOT soak the incision (no tubs, pools, bath, etc.) until you have approval from Dr. Avalynn Bowe.  Contact Dr. Adairs office or go to Emergency Room if: 1. Temperature above 101 F. 2. Increasing pain that is unresponsive to pain  medication or elevation 3. Excessive redness or swelling in your foot 4. Dressing problems - excessive bloody drainage, looseness or tightness, or if dressing gets wet 5. Develop pain, swelling, warmth, or discoloration of your calf  

## 2020-09-08 NOTE — Anesthesia Procedure Notes (Signed)
Procedure Name: LMA Insertion Date/Time: 09/08/2020 2:09 PM Performed by: Ponciano Ort, CRNA Pre-anesthesia Checklist: Patient identified, Emergency Drugs available, Suction available and Patient being monitored Patient Re-evaluated:Patient Re-evaluated prior to induction Oxygen Delivery Method: Circle system utilized Preoxygenation: Pre-oxygenation with 100% oxygen Induction Type: IV induction LMA: LMA inserted LMA Size: 4.0 Number of attempts: 1 Placement Confirmation: positive ETCO2 Tube secured with: Tape Dental Injury: Teeth and Oropharynx as per pre-operative assessment

## 2020-09-09 NOTE — Anesthesia Postprocedure Evaluation (Signed)
Anesthesia Post Note  Patient: Dana Howard  Procedure(s) Performed: OPEN TREATMENT OF LEFT CALCANEUS FRACTURE (Left Foot)     Patient location during evaluation: PACU Anesthesia Type: Regional and General Level of consciousness: awake and alert Pain management: pain level controlled Vital Signs Assessment: post-procedure vital signs reviewed and stable Respiratory status: spontaneous breathing, nonlabored ventilation, respiratory function stable and patient connected to nasal cannula oxygen Cardiovascular status: blood pressure returned to baseline and stable Postop Assessment: no apparent nausea or vomiting Anesthetic complications: no   No complications documented.  Last Vitals:  Vitals:   09/08/20 1645 09/08/20 1700  BP: (!) 140/103 137/85  Pulse: 79 74  Resp: 11 12  Temp:  36.4 C  SpO2: 100% 99%    Last Pain:  Vitals:   09/08/20 1700  TempSrc:   PainSc: 0-No pain                 Mellody Dance

## 2020-09-13 ENCOUNTER — Encounter (HOSPITAL_COMMUNITY): Payer: Self-pay | Admitting: Orthopaedic Surgery

## 2020-09-15 DIAGNOSIS — S92002A Unspecified fracture of left calcaneus, initial encounter for closed fracture: Secondary | ICD-10-CM | POA: Diagnosis not present

## 2020-09-17 ENCOUNTER — Ambulatory Visit: Payer: BC Managed Care – PPO | Attending: Internal Medicine

## 2020-09-17 DIAGNOSIS — Z23 Encounter for immunization: Secondary | ICD-10-CM

## 2020-09-17 NOTE — Progress Notes (Signed)
   Covid-19 Vaccination Clinic  Name:  DAZHANE VILLAGOMEZ    MRN: [id number] DOB: March 14, 1971  09/17/2020  Ms. Marandola was observed post Covid-19 immunization for 15 minutes without incident. She was provided with Vaccine Information Sheet and instruction to access the V-Safe system.   Ms. Radich was instructed to call 911 with any severe reactions post vaccine: Marland Kitchen Difficulty breathing  . Swelling of face and throat  . A fast heartbeat  . A bad rash all over body  . Dizziness and weakness

## 2020-09-21 DIAGNOSIS — S92062A Displaced intraarticular fracture of left calcaneus, initial encounter for closed fracture: Secondary | ICD-10-CM | POA: Diagnosis not present

## 2020-10-16 DIAGNOSIS — S92002A Unspecified fracture of left calcaneus, initial encounter for closed fracture: Secondary | ICD-10-CM | POA: Diagnosis not present

## 2020-10-19 DIAGNOSIS — S92002A Unspecified fracture of left calcaneus, initial encounter for closed fracture: Secondary | ICD-10-CM | POA: Diagnosis not present

## 2020-10-19 DIAGNOSIS — S92062A Displaced intraarticular fracture of left calcaneus, initial encounter for closed fracture: Secondary | ICD-10-CM | POA: Diagnosis not present

## 2020-10-21 DIAGNOSIS — M25672 Stiffness of left ankle, not elsewhere classified: Secondary | ICD-10-CM | POA: Diagnosis not present

## 2020-10-21 DIAGNOSIS — R269 Unspecified abnormalities of gait and mobility: Secondary | ICD-10-CM | POA: Diagnosis not present

## 2020-10-25 DIAGNOSIS — R269 Unspecified abnormalities of gait and mobility: Secondary | ICD-10-CM | POA: Diagnosis not present

## 2020-10-25 DIAGNOSIS — M25672 Stiffness of left ankle, not elsewhere classified: Secondary | ICD-10-CM | POA: Diagnosis not present

## 2020-10-27 DIAGNOSIS — M25672 Stiffness of left ankle, not elsewhere classified: Secondary | ICD-10-CM | POA: Diagnosis not present

## 2020-10-27 DIAGNOSIS — R269 Unspecified abnormalities of gait and mobility: Secondary | ICD-10-CM | POA: Diagnosis not present

## 2020-10-28 DIAGNOSIS — Z03818 Encounter for observation for suspected exposure to other biological agents ruled out: Secondary | ICD-10-CM | POA: Diagnosis not present

## 2020-10-28 DIAGNOSIS — Z20822 Contact with and (suspected) exposure to covid-19: Secondary | ICD-10-CM | POA: Diagnosis not present

## 2020-11-01 DIAGNOSIS — R269 Unspecified abnormalities of gait and mobility: Secondary | ICD-10-CM | POA: Diagnosis not present

## 2020-11-01 DIAGNOSIS — M25672 Stiffness of left ankle, not elsewhere classified: Secondary | ICD-10-CM | POA: Diagnosis not present

## 2020-11-03 DIAGNOSIS — M25672 Stiffness of left ankle, not elsewhere classified: Secondary | ICD-10-CM | POA: Diagnosis not present

## 2020-11-03 DIAGNOSIS — R269 Unspecified abnormalities of gait and mobility: Secondary | ICD-10-CM | POA: Diagnosis not present

## 2020-11-08 DIAGNOSIS — Z1231 Encounter for screening mammogram for malignant neoplasm of breast: Secondary | ICD-10-CM | POA: Diagnosis not present

## 2020-11-09 DIAGNOSIS — M25672 Stiffness of left ankle, not elsewhere classified: Secondary | ICD-10-CM | POA: Diagnosis not present

## 2020-11-09 DIAGNOSIS — R269 Unspecified abnormalities of gait and mobility: Secondary | ICD-10-CM | POA: Diagnosis not present

## 2020-11-10 DIAGNOSIS — M25672 Stiffness of left ankle, not elsewhere classified: Secondary | ICD-10-CM | POA: Diagnosis not present

## 2020-11-10 DIAGNOSIS — R269 Unspecified abnormalities of gait and mobility: Secondary | ICD-10-CM | POA: Diagnosis not present

## 2020-11-14 DIAGNOSIS — R269 Unspecified abnormalities of gait and mobility: Secondary | ICD-10-CM | POA: Diagnosis not present

## 2020-11-14 DIAGNOSIS — M25672 Stiffness of left ankle, not elsewhere classified: Secondary | ICD-10-CM | POA: Diagnosis not present

## 2020-11-15 DIAGNOSIS — R269 Unspecified abnormalities of gait and mobility: Secondary | ICD-10-CM | POA: Diagnosis not present

## 2020-11-15 DIAGNOSIS — S92002A Unspecified fracture of left calcaneus, initial encounter for closed fracture: Secondary | ICD-10-CM | POA: Diagnosis not present

## 2020-11-15 DIAGNOSIS — M25672 Stiffness of left ankle, not elsewhere classified: Secondary | ICD-10-CM | POA: Diagnosis not present

## 2020-11-17 DIAGNOSIS — Z20822 Contact with and (suspected) exposure to covid-19: Secondary | ICD-10-CM | POA: Diagnosis not present

## 2020-11-21 DIAGNOSIS — S92002A Unspecified fracture of left calcaneus, initial encounter for closed fracture: Secondary | ICD-10-CM | POA: Diagnosis not present

## 2020-11-23 DIAGNOSIS — R269 Unspecified abnormalities of gait and mobility: Secondary | ICD-10-CM | POA: Diagnosis not present

## 2020-11-23 DIAGNOSIS — M25672 Stiffness of left ankle, not elsewhere classified: Secondary | ICD-10-CM | POA: Diagnosis not present

## 2020-12-16 DIAGNOSIS — S92002A Unspecified fracture of left calcaneus, initial encounter for closed fracture: Secondary | ICD-10-CM | POA: Diagnosis not present

## 2020-12-23 DIAGNOSIS — E559 Vitamin D deficiency, unspecified: Secondary | ICD-10-CM | POA: Diagnosis not present

## 2020-12-23 DIAGNOSIS — Z131 Encounter for screening for diabetes mellitus: Secondary | ICD-10-CM | POA: Diagnosis not present

## 2020-12-23 DIAGNOSIS — Z0000 Encounter for general adult medical examination without abnormal findings: Secondary | ICD-10-CM | POA: Diagnosis not present

## 2020-12-23 DIAGNOSIS — Z130 Encounter for screening for diseases of the blood and blood-forming organs and certain disorders involving the immune mechanism: Secondary | ICD-10-CM | POA: Diagnosis not present

## 2020-12-23 DIAGNOSIS — Z13220 Encounter for screening for lipoid disorders: Secondary | ICD-10-CM | POA: Diagnosis not present

## 2020-12-23 DIAGNOSIS — Z01419 Encounter for gynecological examination (general) (routine) without abnormal findings: Secondary | ICD-10-CM | POA: Diagnosis not present

## 2020-12-23 DIAGNOSIS — R7303 Prediabetes: Secondary | ICD-10-CM | POA: Diagnosis not present

## 2020-12-28 DIAGNOSIS — D251 Intramural leiomyoma of uterus: Secondary | ICD-10-CM | POA: Diagnosis not present

## 2020-12-28 DIAGNOSIS — N83291 Other ovarian cyst, right side: Secondary | ICD-10-CM | POA: Diagnosis not present

## 2020-12-28 DIAGNOSIS — N924 Excessive bleeding in the premenopausal period: Secondary | ICD-10-CM | POA: Diagnosis not present

## 2020-12-29 DIAGNOSIS — Z20822 Contact with and (suspected) exposure to covid-19: Secondary | ICD-10-CM | POA: Diagnosis not present

## 2020-12-29 DIAGNOSIS — Z03818 Encounter for observation for suspected exposure to other biological agents ruled out: Secondary | ICD-10-CM | POA: Diagnosis not present

## 2020-12-30 DIAGNOSIS — R7303 Prediabetes: Secondary | ICD-10-CM | POA: Diagnosis not present

## 2021-01-16 DIAGNOSIS — S92002A Unspecified fracture of left calcaneus, initial encounter for closed fracture: Secondary | ICD-10-CM | POA: Diagnosis not present

## 2021-01-20 DIAGNOSIS — S92002A Unspecified fracture of left calcaneus, initial encounter for closed fracture: Secondary | ICD-10-CM | POA: Diagnosis not present

## 2021-02-11 DIAGNOSIS — R7303 Prediabetes: Secondary | ICD-10-CM | POA: Diagnosis not present

## 2021-02-13 DIAGNOSIS — S92002A Unspecified fracture of left calcaneus, initial encounter for closed fracture: Secondary | ICD-10-CM | POA: Diagnosis not present

## 2021-03-12 DIAGNOSIS — R7303 Prediabetes: Secondary | ICD-10-CM | POA: Diagnosis not present

## 2021-03-16 DIAGNOSIS — S92002A Unspecified fracture of left calcaneus, initial encounter for closed fracture: Secondary | ICD-10-CM | POA: Diagnosis not present

## 2021-04-15 DIAGNOSIS — S92002A Unspecified fracture of left calcaneus, initial encounter for closed fracture: Secondary | ICD-10-CM | POA: Diagnosis not present

## 2021-04-18 ENCOUNTER — Other Ambulatory Visit: Payer: Self-pay | Admitting: Family Medicine

## 2021-04-18 ENCOUNTER — Ambulatory Visit
Admission: RE | Admit: 2021-04-18 | Discharge: 2021-04-18 | Disposition: A | Discharge status: Home / Self Care | Payer: BC Managed Care – PPO | Source: Ambulatory Visit | Attending: Family Medicine | Admitting: Family Medicine

## 2021-04-18 DIAGNOSIS — R7611 Nonspecific reaction to tuberculin skin test without active tuberculosis: Secondary | ICD-10-CM

## 2021-05-07 IMAGING — RF DG C-ARM 1-60 MIN
1 series · 4 of 4 positions shown · IV contrast (agent unspecified)
Comparison: CT 09/01/2020.

CLINICAL DATA: ORIF.

EXAM:
DG C-ARM 1-60 MIN; LEFT OS CALCIS - 2+ VIEW
CONTRAST:  None.
FLUOROSCOPY TIME:  Fluoroscopy Time:  0 minutes 46 seconds
Radiation Exposure Index (if provided by the fluoroscopic device):
2.4 mGy

[Series 1: run · 4 of 4 slices shown]
[im 1/4]
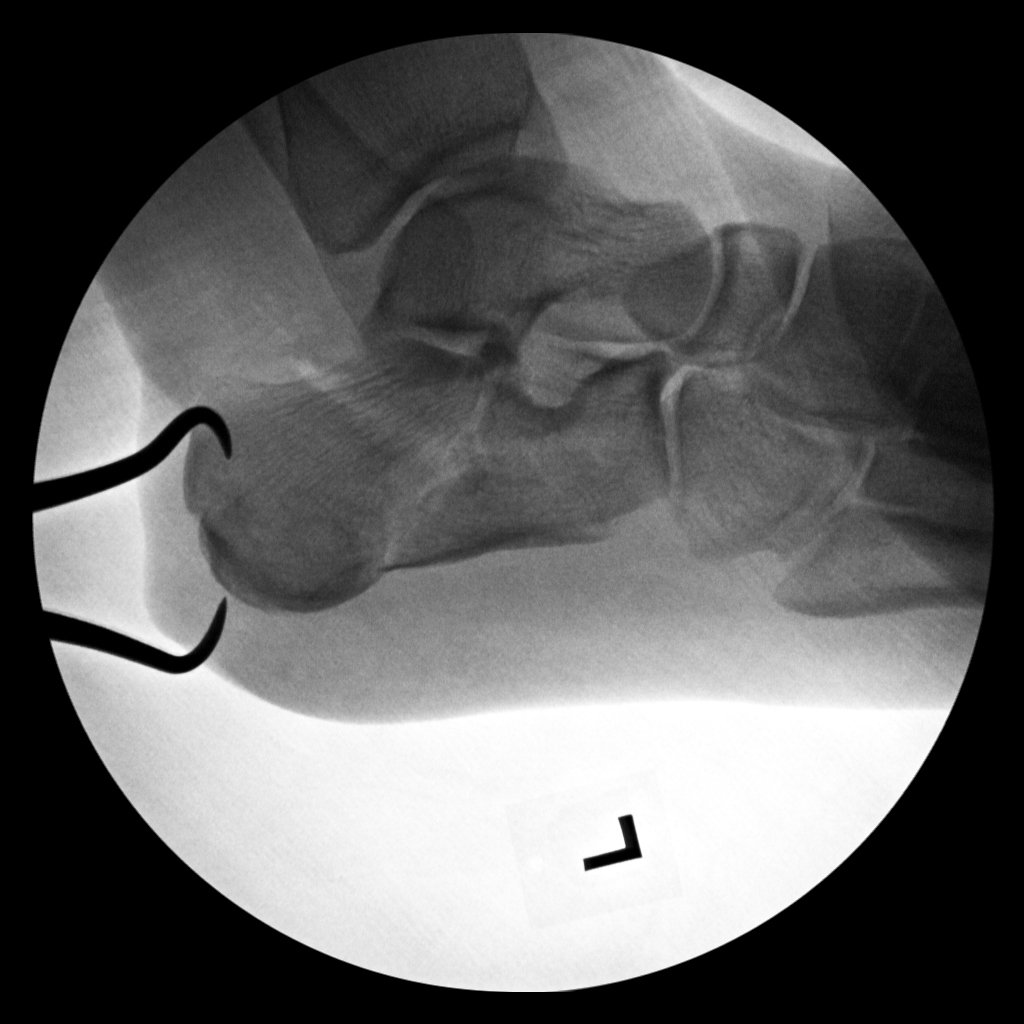
[im 2/4]
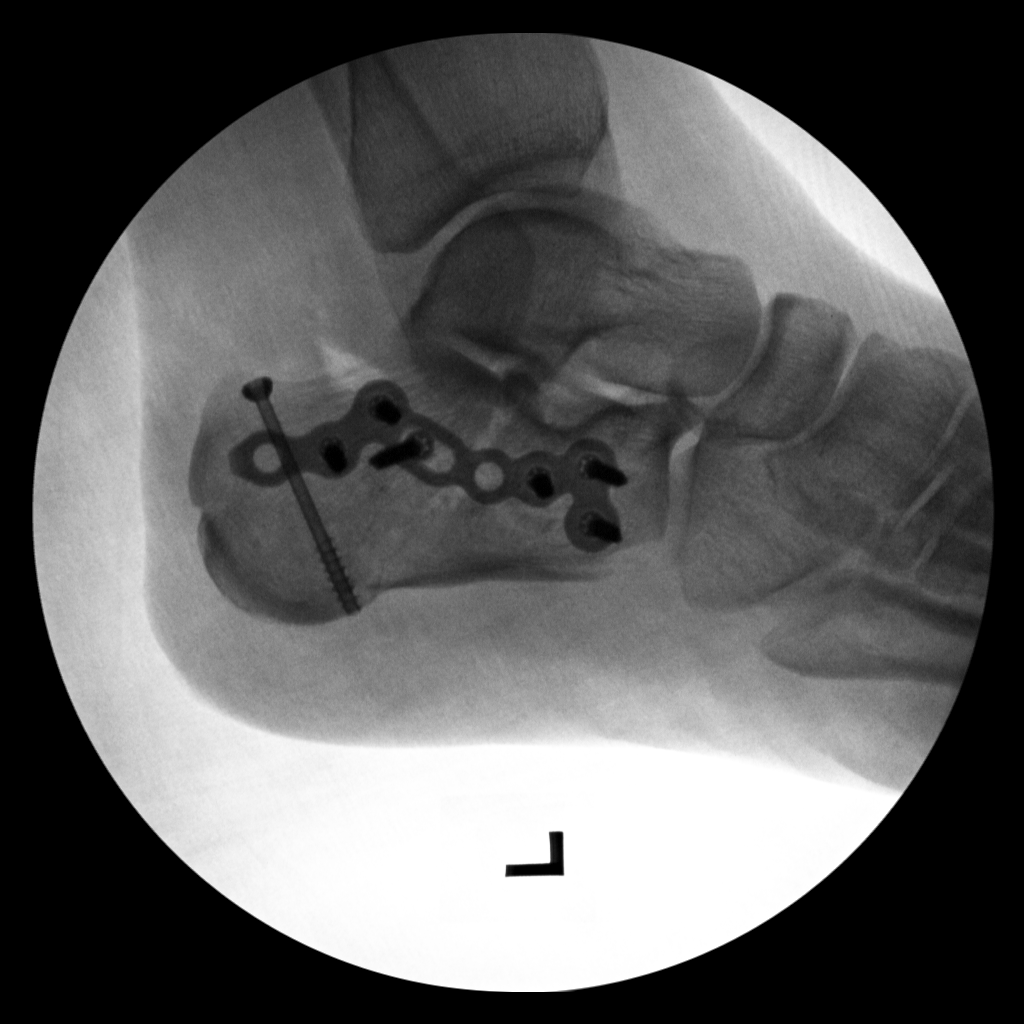
[im 3/4]
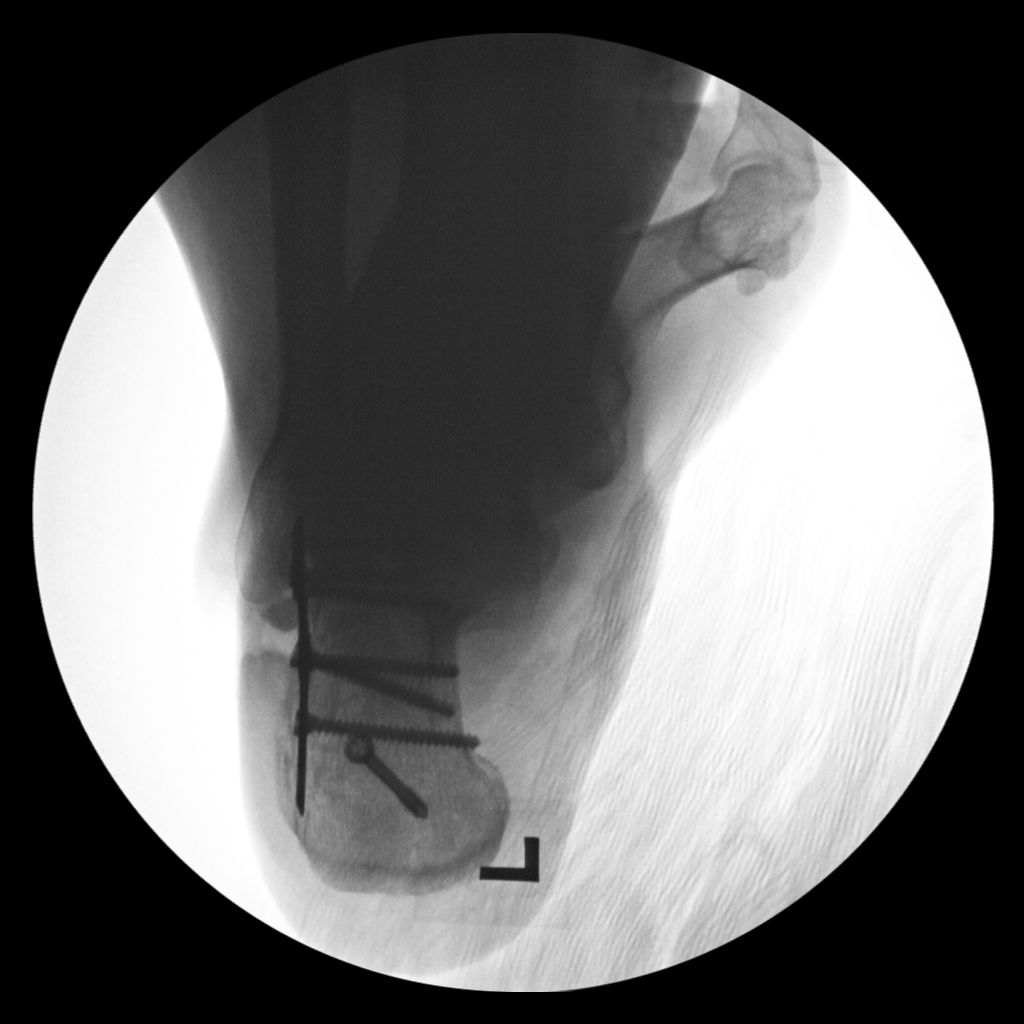
[im 4/4]
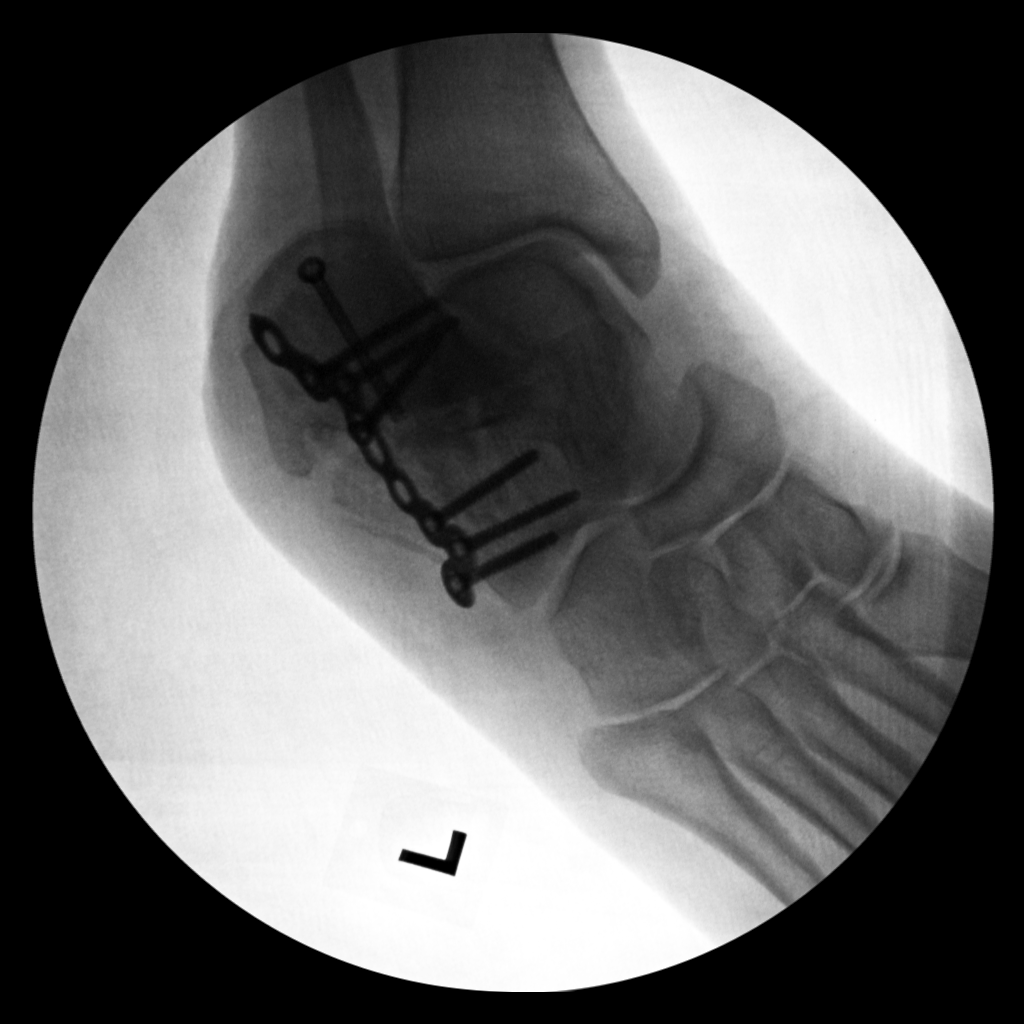

[4 of 4 positions shown; findings below may reference images not displayed]

FINDINGS: ORIF left calcaneus. Hardware intact. Displaced fractures again
noted.
IMPRESSION: ORIF left calcaneus.

## 2021-05-07 IMAGING — RF DG OS CALCIS 2+V*L*
1 series · 4 of 4 positions shown · IV contrast (agent unspecified)
Comparison: CT 09/01/2020.

CLINICAL DATA: ORIF.

EXAM:
DG C-ARM 1-60 MIN; LEFT OS CALCIS - 2+ VIEW
CONTRAST:  None.
FLUOROSCOPY TIME:  Fluoroscopy Time:  0 minutes 46 seconds
Radiation Exposure Index (if provided by the fluoroscopic device):
2.4 mGy

[Series 1: run · 4 of 4 slices shown]
[im 1/4]
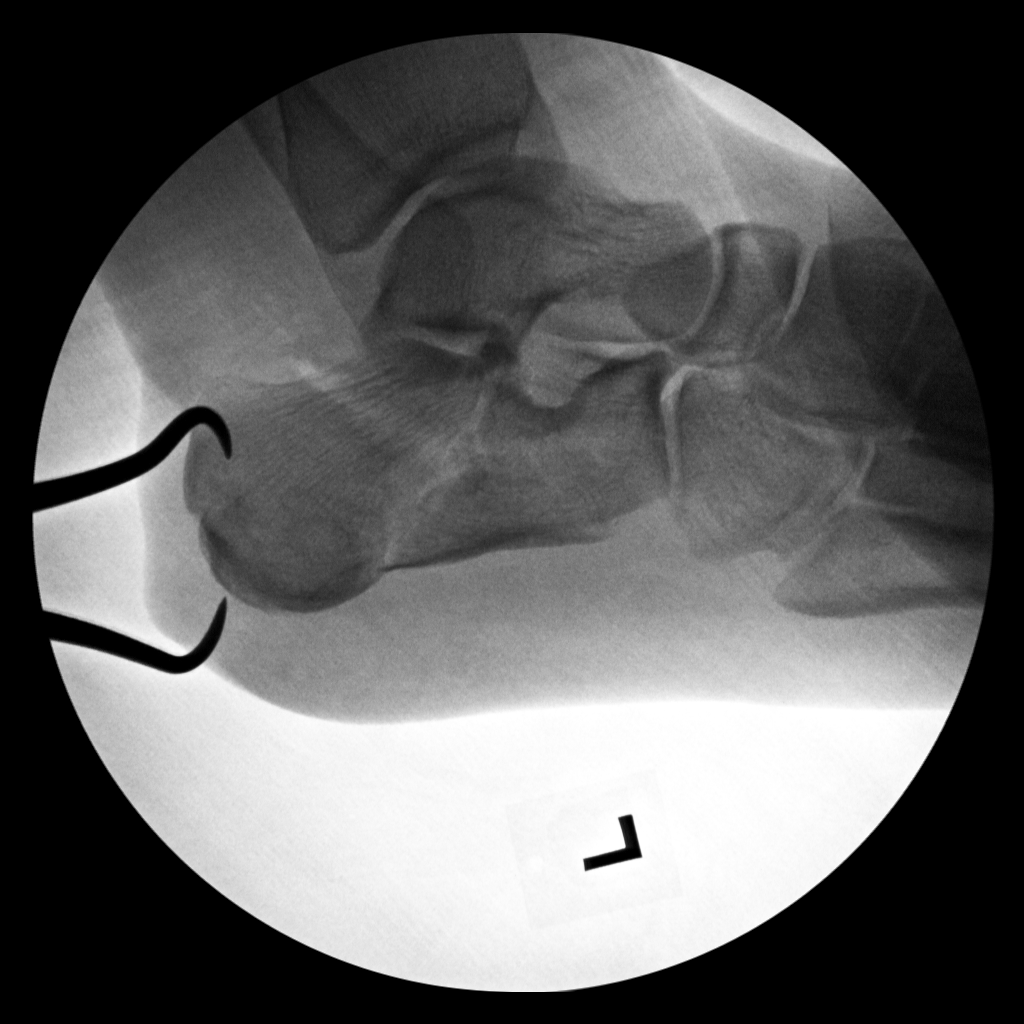
[im 2/4]
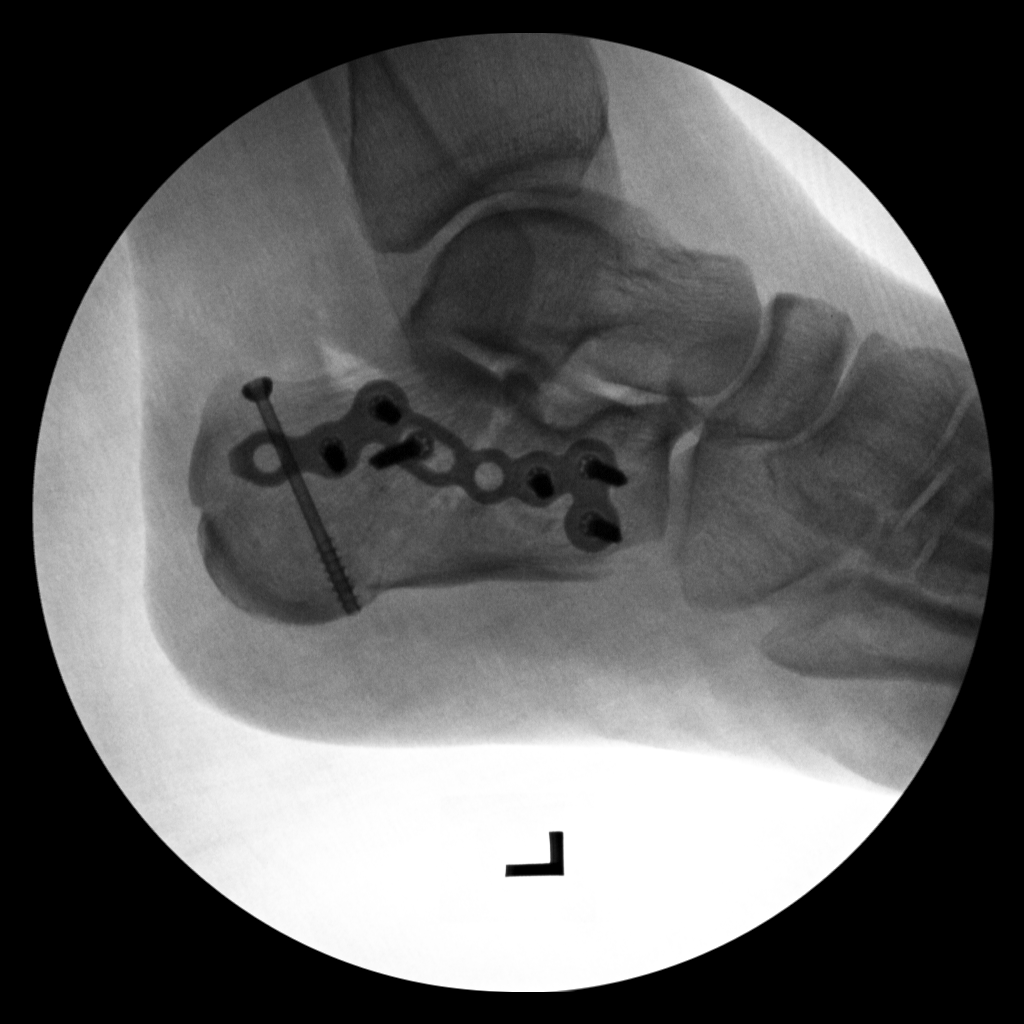
[im 3/4]
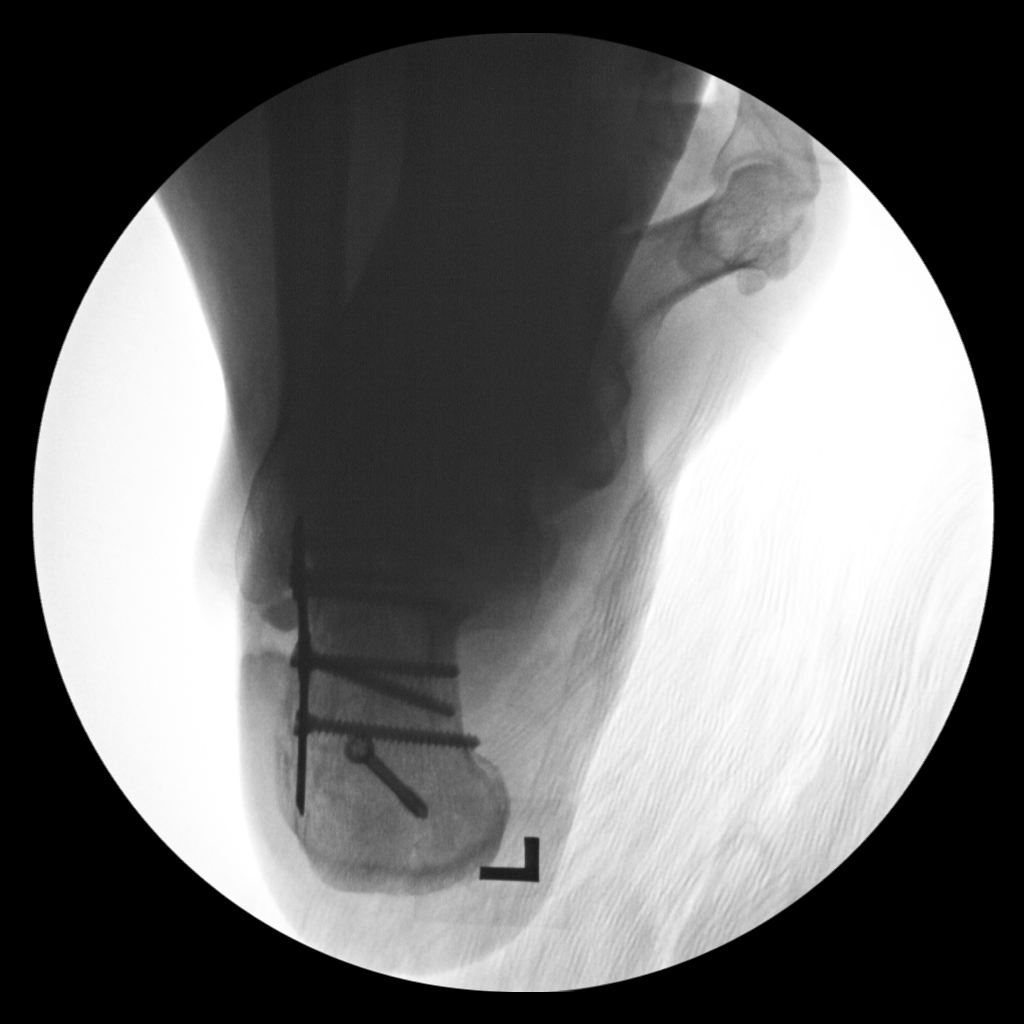
[im 4/4]
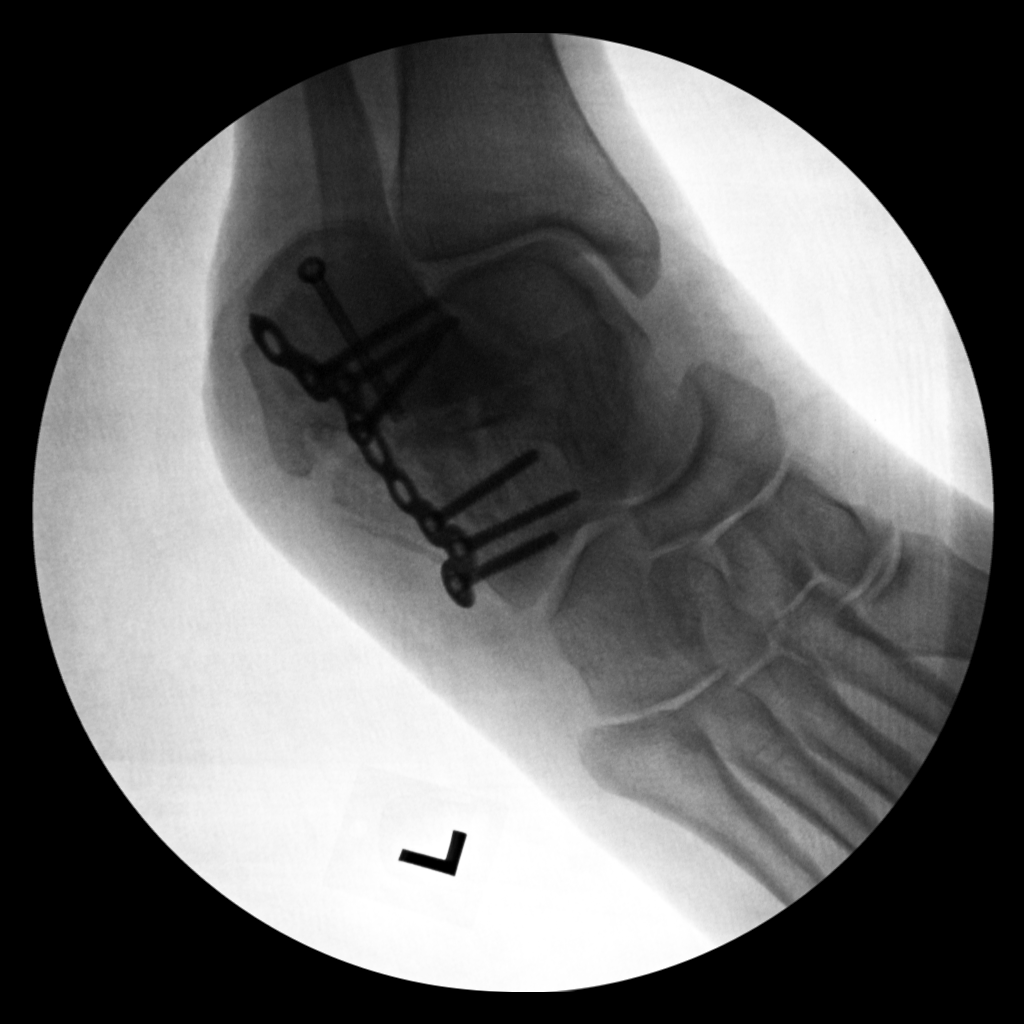

[4 of 4 positions shown; findings below may reference images not displayed]

FINDINGS: ORIF left calcaneus. Hardware intact. Displaced fractures again
noted.
IMPRESSION: ORIF left calcaneus.

## 2021-05-16 DIAGNOSIS — S92002A Unspecified fracture of left calcaneus, initial encounter for closed fracture: Secondary | ICD-10-CM | POA: Diagnosis not present

## 2021-06-01 DIAGNOSIS — Z20828 Contact with and (suspected) exposure to other viral communicable diseases: Secondary | ICD-10-CM | POA: Diagnosis not present

## 2021-06-01 DIAGNOSIS — Z20822 Contact with and (suspected) exposure to covid-19: Secondary | ICD-10-CM | POA: Diagnosis not present

## 2021-06-06 DIAGNOSIS — Z20822 Contact with and (suspected) exposure to covid-19: Secondary | ICD-10-CM | POA: Diagnosis not present

## 2021-06-06 DIAGNOSIS — Z20828 Contact with and (suspected) exposure to other viral communicable diseases: Secondary | ICD-10-CM | POA: Diagnosis not present

## 2021-06-08 DIAGNOSIS — Z20822 Contact with and (suspected) exposure to covid-19: Secondary | ICD-10-CM | POA: Diagnosis not present

## 2021-06-09 DIAGNOSIS — Z20828 Contact with and (suspected) exposure to other viral communicable diseases: Secondary | ICD-10-CM | POA: Diagnosis not present

## 2021-06-11 DIAGNOSIS — Z20822 Contact with and (suspected) exposure to covid-19: Secondary | ICD-10-CM | POA: Diagnosis not present

## 2021-06-14 DIAGNOSIS — Z20828 Contact with and (suspected) exposure to other viral communicable diseases: Secondary | ICD-10-CM | POA: Diagnosis not present

## 2021-06-15 DIAGNOSIS — S92002A Unspecified fracture of left calcaneus, initial encounter for closed fracture: Secondary | ICD-10-CM | POA: Diagnosis not present

## 2021-08-15 DIAGNOSIS — Z23 Encounter for immunization: Secondary | ICD-10-CM | POA: Diagnosis not present

## 2021-10-06 DIAGNOSIS — Z1211 Encounter for screening for malignant neoplasm of colon: Secondary | ICD-10-CM | POA: Diagnosis not present

## 2021-12-15 IMAGING — DX DG CHEST 2V
2 series · 2 of 2 positions shown · non-contrast
Comparison: None.

CLINICAL DATA: 50-year-old female with TB test.

EXAM:
CHEST - 2 VIEW

[dg chest 2 view (1 of 2)]
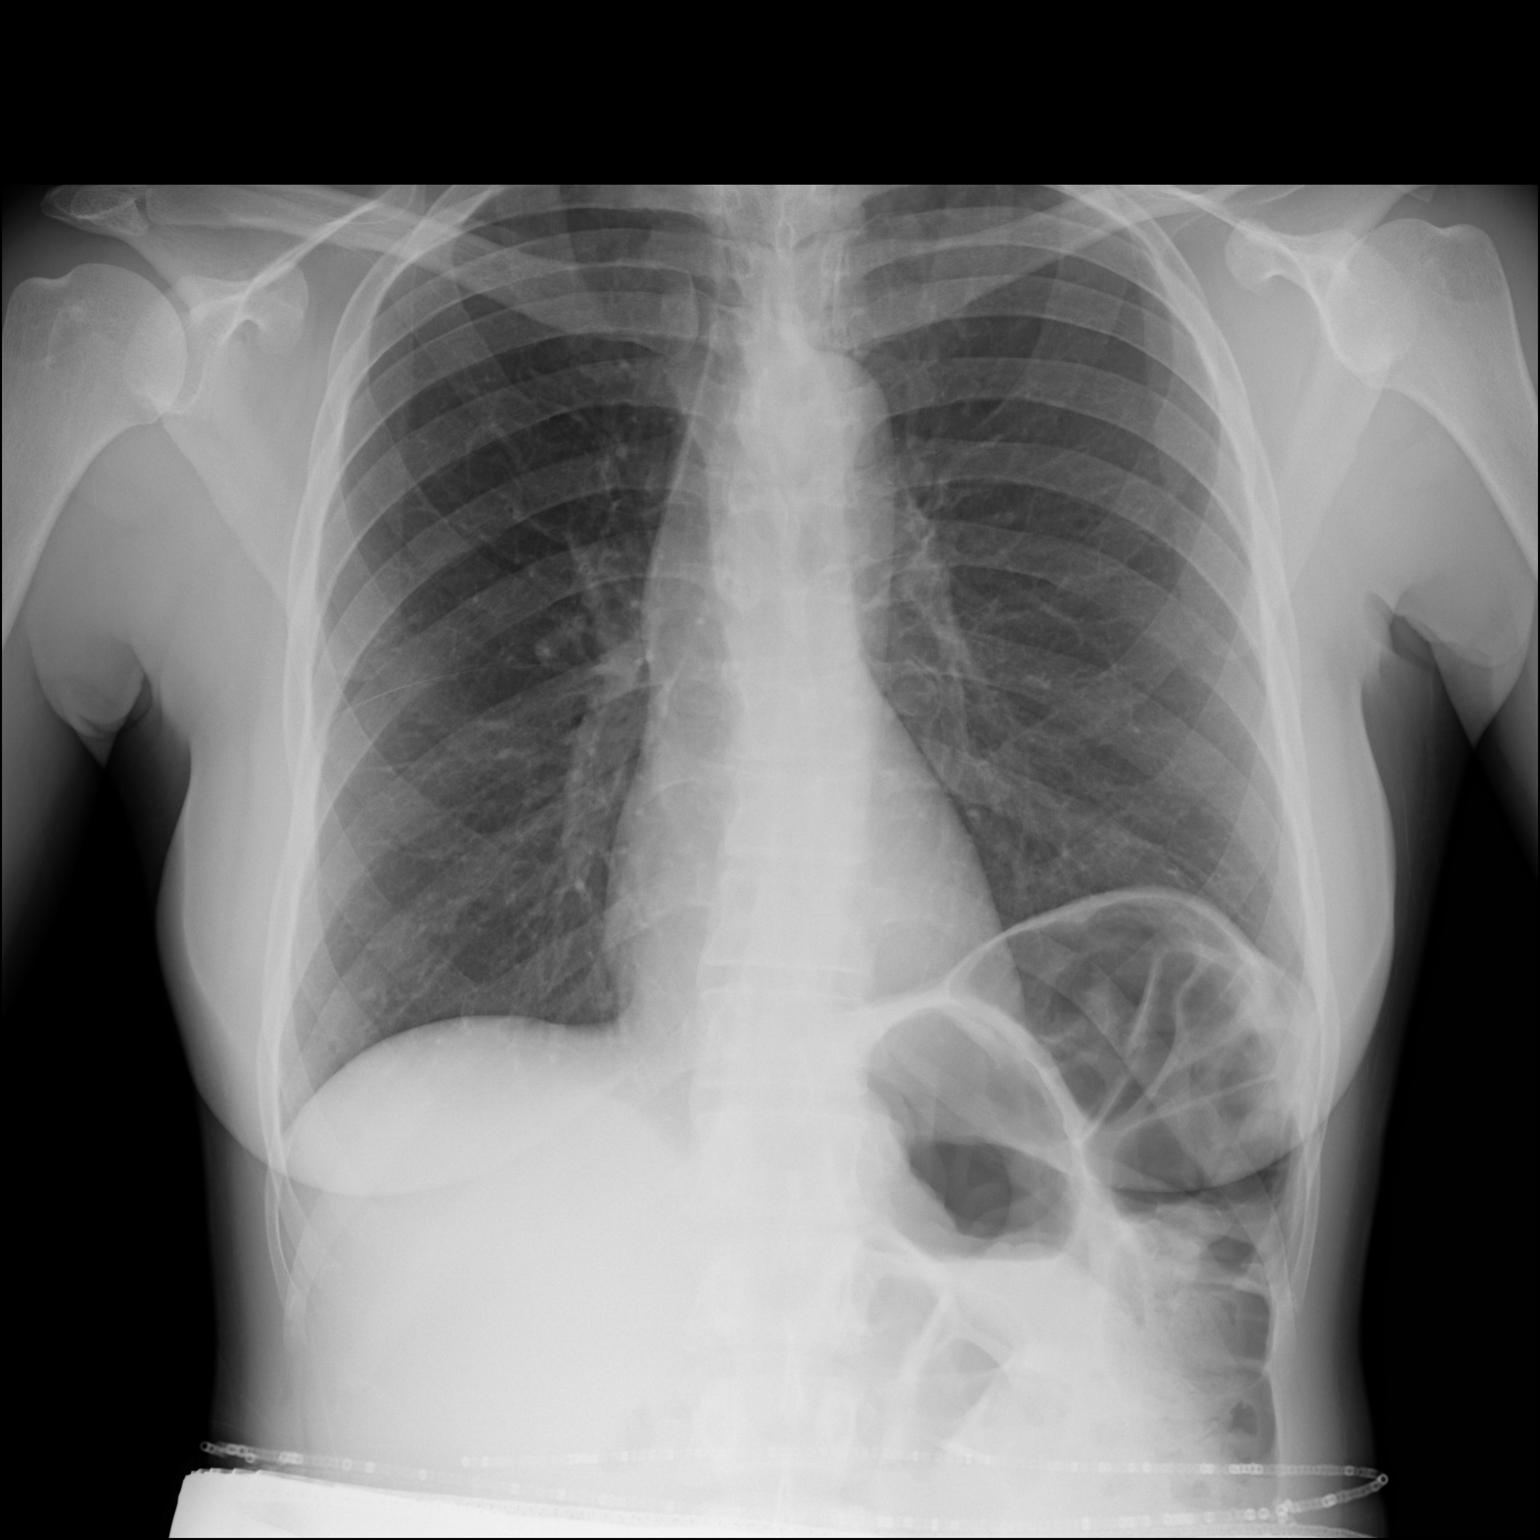

[dg chest 2 view (2 of 2)]
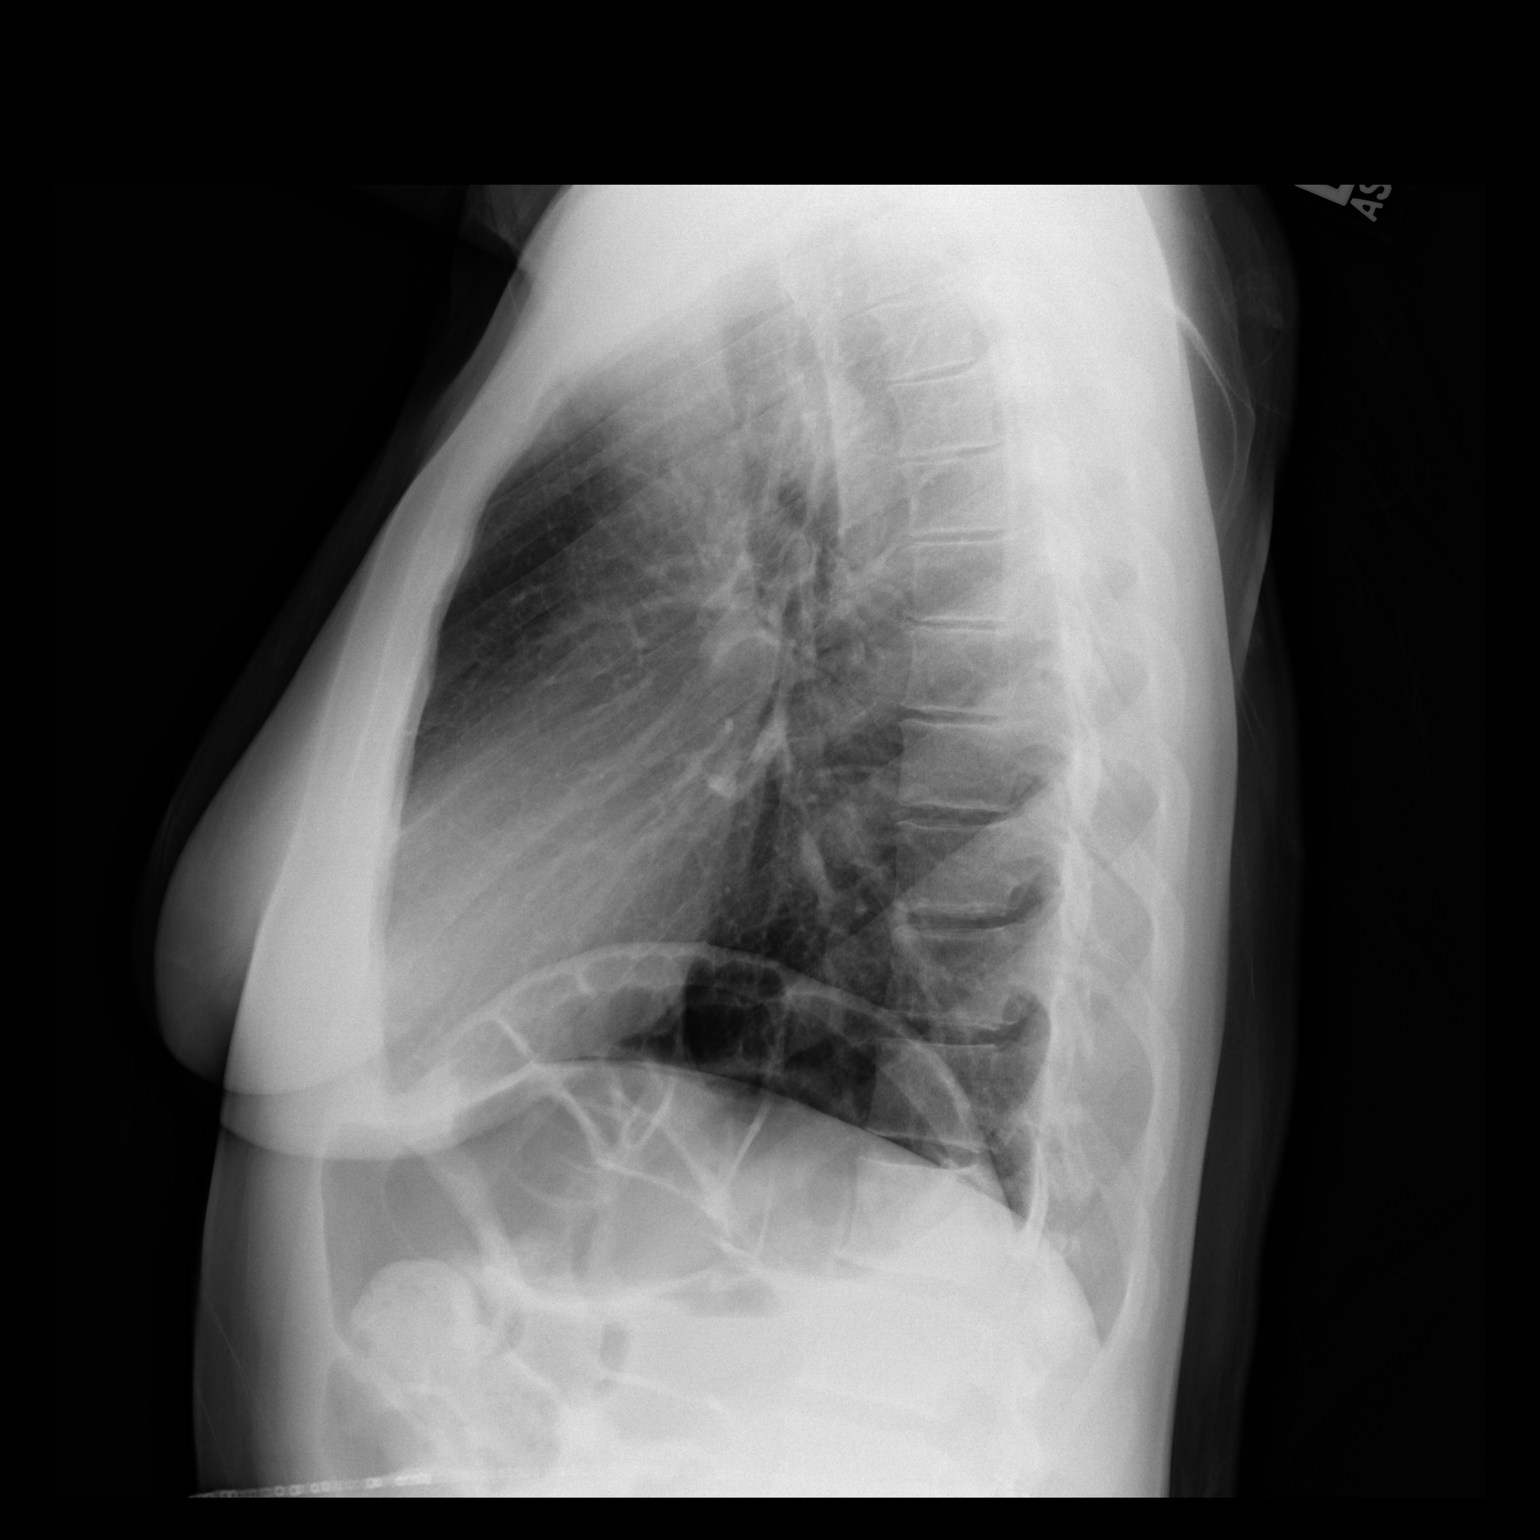

[2 of 2 positions shown; findings below may reference images not displayed]

FINDINGS: The heart size and mediastinal contours are within normal limits.
Both lungs are clear. The visualized skeletal structures are
unremarkable.
IMPRESSION: No active cardiopulmonary disease.

## 2022-02-21 DIAGNOSIS — Z1231 Encounter for screening mammogram for malignant neoplasm of breast: Secondary | ICD-10-CM | POA: Diagnosis not present

## 2022-08-16 DIAGNOSIS — Z20822 Contact with and (suspected) exposure to covid-19: Secondary | ICD-10-CM | POA: Diagnosis not present

## 2022-08-20 DIAGNOSIS — Z23 Encounter for immunization: Secondary | ICD-10-CM | POA: Diagnosis not present

## 2023-01-23 DIAGNOSIS — Z01419 Encounter for gynecological examination (general) (routine) without abnormal findings: Secondary | ICD-10-CM | POA: Diagnosis not present

## 2023-01-23 DIAGNOSIS — Z1159 Encounter for screening for other viral diseases: Secondary | ICD-10-CM | POA: Diagnosis not present

## 2023-01-23 DIAGNOSIS — Z113 Encounter for screening for infections with a predominantly sexual mode of transmission: Secondary | ICD-10-CM | POA: Diagnosis not present

## 2023-01-23 DIAGNOSIS — Z114 Encounter for screening for human immunodeficiency virus [HIV]: Secondary | ICD-10-CM | POA: Diagnosis not present

## 2023-01-23 DIAGNOSIS — Z13220 Encounter for screening for lipoid disorders: Secondary | ICD-10-CM | POA: Diagnosis not present

## 2023-01-23 DIAGNOSIS — I10 Essential (primary) hypertension: Secondary | ICD-10-CM | POA: Diagnosis not present

## 2023-01-23 DIAGNOSIS — E559 Vitamin D deficiency, unspecified: Secondary | ICD-10-CM | POA: Diagnosis not present

## 2023-01-23 DIAGNOSIS — Z131 Encounter for screening for diabetes mellitus: Secondary | ICD-10-CM | POA: Diagnosis not present

## 2023-01-23 DIAGNOSIS — D259 Leiomyoma of uterus, unspecified: Secondary | ICD-10-CM | POA: Diagnosis not present

## 2023-01-31 DIAGNOSIS — H5213 Myopia, bilateral: Secondary | ICD-10-CM | POA: Diagnosis not present

## 2023-01-31 DIAGNOSIS — H524 Presbyopia: Secondary | ICD-10-CM | POA: Diagnosis not present

## 2023-02-27 DIAGNOSIS — Z1231 Encounter for screening mammogram for malignant neoplasm of breast: Secondary | ICD-10-CM | POA: Diagnosis not present

## 2024-02-04 DIAGNOSIS — Z114 Encounter for screening for human immunodeficiency virus [HIV]: Secondary | ICD-10-CM | POA: Diagnosis not present

## 2024-02-04 DIAGNOSIS — Z1159 Encounter for screening for other viral diseases: Secondary | ICD-10-CM | POA: Diagnosis not present

## 2024-02-04 DIAGNOSIS — Z113 Encounter for screening for infections with a predominantly sexual mode of transmission: Secondary | ICD-10-CM | POA: Diagnosis not present

## 2024-02-04 DIAGNOSIS — Z01419 Encounter for gynecological examination (general) (routine) without abnormal findings: Secondary | ICD-10-CM | POA: Diagnosis not present

## 2024-02-04 DIAGNOSIS — E559 Vitamin D deficiency, unspecified: Secondary | ICD-10-CM | POA: Diagnosis not present

## 2024-02-04 DIAGNOSIS — R32 Unspecified urinary incontinence: Secondary | ICD-10-CM | POA: Diagnosis not present

## 2024-02-04 DIAGNOSIS — Z13220 Encounter for screening for lipoid disorders: Secondary | ICD-10-CM | POA: Diagnosis not present

## 2024-02-04 DIAGNOSIS — Z131 Encounter for screening for diabetes mellitus: Secondary | ICD-10-CM | POA: Diagnosis not present

## 2024-02-08 DIAGNOSIS — H524 Presbyopia: Secondary | ICD-10-CM | POA: Diagnosis not present

## 2024-03-04 DIAGNOSIS — Z1231 Encounter for screening mammogram for malignant neoplasm of breast: Secondary | ICD-10-CM | POA: Diagnosis not present

## 2024-06-20 DIAGNOSIS — M722 Plantar fascial fibromatosis: Secondary | ICD-10-CM | POA: Diagnosis not present

## 2024-08-24 DIAGNOSIS — Z23 Encounter for immunization: Secondary | ICD-10-CM | POA: Diagnosis not present
